# Patient Record
Sex: Female | Born: 1980 | Race: White | Hispanic: No | Marital: Married | Smoking: Former smoker
Health system: Southern US, Community
[De-identification: ages and names within clinical notes are randomized; demographics above are authoritative.]

## PROBLEM LIST (undated history)

## (undated) DIAGNOSIS — S8292XA Unspecified fracture of left lower leg, initial encounter for closed fracture: Secondary | ICD-10-CM

## (undated) DIAGNOSIS — N750 Cyst of Bartholin's gland: Secondary | ICD-10-CM

## (undated) DIAGNOSIS — S82891A Other fracture of right lower leg, initial encounter for closed fracture: Secondary | ICD-10-CM

## (undated) DIAGNOSIS — F419 Anxiety disorder, unspecified: Secondary | ICD-10-CM

## (undated) DIAGNOSIS — E282 Polycystic ovarian syndrome: Secondary | ICD-10-CM

## (undated) DIAGNOSIS — R87629 Unspecified abnormal cytological findings in specimens from vagina: Secondary | ICD-10-CM

## (undated) HISTORY — PX: WISDOM TOOTH EXTRACTION: SHX21

---

## 2011-09-09 ENCOUNTER — Ambulatory Visit: Payer: PRIVATE HEALTH INSURANCE

## 2011-09-09 ENCOUNTER — Ambulatory Visit (INDEPENDENT_AMBULATORY_CARE_PROVIDER_SITE_OTHER): Payer: PRIVATE HEALTH INSURANCE | Admitting: Internal Medicine

## 2011-09-09 DIAGNOSIS — M25571 Pain in right ankle and joints of right foot: Secondary | ICD-10-CM

## 2011-09-09 DIAGNOSIS — S92309A Fracture of unspecified metatarsal bone(s), unspecified foot, initial encounter for closed fracture: Secondary | ICD-10-CM

## 2011-09-09 DIAGNOSIS — M25579 Pain in unspecified ankle and joints of unspecified foot: Secondary | ICD-10-CM

## 2011-09-09 MED ORDER — HYDROCODONE-ACETAMINOPHEN 5-500 MG PO TABS: 1.0000 | ORAL_TABLET | Freq: Three times a day (TID) | ORAL | Status: AC | PRN

## 2011-09-09 NOTE — Patient Instructions (Signed)
Metatarsal Fracture, Undisplaced   A metatarsal fracture is a break in the bone(s) of the foot. These are the bones of the foot that connect your toes to the bones of the ankle.   DIAGNOSIS   The diagnoses of these fractures are usually made with X-rays. If there are problems in the forefoot and x-rays are normal a later bone scan will usually make the diagnosis.   TREATMENT AND HOME CARE INSTRUCTIONS   Treatment may or may not include a cast or walking shoe. When casts are needed the use is usually for short periods of time so as not to slow down healing with muscle wasting (atrophy).   Activities should be stopped until further advised by your caregiver.   Wear shoes with adequate shock absorbing capabilities and stiff soles.   Alternative exercise may be undertaken while waiting for healing. These may include bicycling and swimming, or as your caregiver suggests.   It is important to keep all follow-up visits or specialty referrals. The failure to keep these appointments could result in improper bone healing and chronic pain or disability.   Warning: Do not drive a car or operate a motor vehicle until your caregiver specifically tells you it is safe to do so.   IF YOU DO NOT HAVE A CAST OR SPLINT:   You may walk on your injured foot as tolerated or advised.   Do not put any weight on your injured foot for as long as directed by your caregiver. Slowly increase the amount of time you walk on the foot as the pain allows or as advised.   Use crutches until you can bear weight without pain. A gradual increase in weight bearing may help.   Apply ice to the injury for 15 to 20 minutes each hour while awake for the first 2 days. Put the ice in a plastic bag and place a towel between the bag of ice and your skin.   Only take over-the-counter or prescription medicines for pain, discomfort, or fever as directed by your caregiver.   SEEK IMMEDIATE MEDICAL CARE IF:   Your cast gets damaged or breaks.   You have continued  severe pain or more swelling than you did before the cast was put on, or the pain is not controlled with medications.   Your skin or nails below the injury turn blue or grey, or feel cold or numb.   There is a bad smell, or new stains or pus-like (purulent) drainage coming from the cast.   MAKE SURE YOU:   Understand these instructions.   Will watch your condition.   Will get help right away if you are not doing well or get worse.   Document Released: 03/12/2002 Document Revised: 06/09/2011 Document Reviewed: 02/01/2008   Exit[employer] Patient Information 2012 [employer].

## 2011-09-09 NOTE — Progress Notes (Signed)
  Subjective:    Patient ID: Angela George, female    DOB: Nov 23, 1980, 31 y.o.   MRN: [id number]  HPIFell last night coming out of her house on the porch Today can bear weight on right foot and there is marked swelling along the lateral aspect of the foot     Review of Systems     Objective:   Physical Exam  The right foot has abrasions on the fourth and fifth Toes with marked swelling along the lateral aspect of the foot and great tenderness over the proximal fifth metatarsal  the ankle examination is normal      [facility] reading (PRIMARY) by  Dr.Aqueelah Cotrell=Fracture of fifth metatarsal  Assessment & Plan:  Fracture fifth metatarsal in stable position  Long Cam Walker plus crutches with weightbearing only as tolerated until followup with orthopedics at [facility] next week

## 2013-06-19 ENCOUNTER — Ambulatory Visit (INDEPENDENT_AMBULATORY_CARE_PROVIDER_SITE_OTHER): Payer: BC Managed Care – PPO | Admitting: Family Medicine

## 2013-06-19 VITALS — BP 126/74 | HR 83 | Temp 98.7°F | Resp 17 | Ht 64.5 in | Wt 144.0 lb

## 2013-06-19 DIAGNOSIS — H6983 Other specified disorders of Eustachian tube, bilateral: Secondary | ICD-10-CM

## 2013-06-19 DIAGNOSIS — H6980 Other specified disorders of Eustachian tube, unspecified ear: Secondary | ICD-10-CM

## 2013-06-19 DIAGNOSIS — H6590 Unspecified nonsuppurative otitis media, unspecified ear: Secondary | ICD-10-CM

## 2013-06-19 MED ORDER — AMOXICILLIN-POT CLAVULANATE 875-125 MG PO TABS: 1.0000 | ORAL_TABLET | Freq: Two times a day (BID) | ORAL | Status: DC

## 2013-06-19 MED ORDER — PSEUDOEPHEDRINE HCL ER 120 MG PO TB12: 120.0000 mg | ORAL_TABLET | Freq: Two times a day (BID) | ORAL | Status: DC

## 2013-06-19 MED ORDER — FLUTICASONE PROPIONATE 50 MCG/ACT NA SUSP: 2.0000 | Freq: Every day | NASAL | Status: DC

## 2013-06-19 NOTE — Progress Notes (Signed)
Subjective:    Patient ID: Angela George, female    DOB: 04-20-1981, 32 y.o.   MRN: [id number] This chart was scribed for Sherren Mocha, MD by Valera Castle, ED Scribe. This patient was seen in room 2 and the patient's care was started at 7:50 PM.  Chief Complaint  Patient presents with  . Otalgia    HPI Angela George is a 32 y.o. female with h/o ear infections who presents to the [facility] complaining of right ear otalgia, onset 2 days ago. She reports the pain is deep inside her ear. She reports associated sore throat, swollen glands. She reports having an ear infection 1 month ago. She reports she usually has pain with her ear infections, and states sometimes she has chills, but rarely has fevers. She reports that her ears pop a lot. She reports that using her blow dryer over her ear will relieve the pain temporarily. She reports trying some Advil, with no relief. She denies having tried Sudafed and nasal sprays. She states she uses humidifier in the winter, but has not pulled it out yet. She denies knowing if she has been around anyone who has been sick. She denies fever, chills, congestion, cough, and any other associated symptoms. She denies any known allergies. She denies any other medical history.   PCP - No primary provider on file.  There are no active problems to display for this patient.  History reviewed. No pertinent past medical history. History reviewed. No pertinent past surgical history. No Known Allergies Prior to Admission medications   Not on File   No family history on file. History   Social History  . Marital Status: Married    Spouse Name: N/A    Number of Children: N/A  . Years of Education: N/A   Occupational History  . Not on file.   Social History Main Topics  . Smoking status: Passive Smoke Exposure - Never Smoker    Types: Cigarettes  . Smokeless tobacco: Not on file  . Alcohol Use: Not on file  . Drug Use: Not on file  . Sexual Activity: Not on  file   Other Topics Concern  . Not on file   Social History Narrative  . No narrative on file    Review of Systems  Constitutional: Negative for fever and chills.  HENT: Positive for ear pain (right ear) and sore throat. Negative for congestion, ear discharge, postnasal drip, rhinorrhea, sinus pressure, sneezing, tinnitus and trouble swallowing.   Respiratory: Negative for cough, shortness of breath and wheezing.   Neurological: Negative for headaches.  Hematological: Positive for adenopathy.  Psychiatric/Behavioral: Negative for sleep disturbance.    BP 126/74  Pulse 83  Temp(Src) 98.7 F (37.1 C) (Oral)  Resp 17  Ht 5' 4.5" (1.638 m)  Wt 144 lb (65.318 kg)  BMI 24.34 kg/m2  SpO2 100%  LMP 06/13/2013 Objective:   Physical Exam  Nursing note and vitals reviewed. Constitutional: She is oriented to person, place, and time. She appears well-developed and well-nourished. No distress.  HENT:  Head: Normocephalic and atraumatic.  Right Ear: External ear and ear canal normal. Tympanic membrane is retracted. Tympanic membrane is not erythematous. A middle ear effusion is present.  Left Ear: External ear and ear canal normal. Tympanic membrane is erythematous.  Nose: Nose normal. No mucosal edema.  Mouth/Throat: Posterior oropharyngeal erythema present. No oropharyngeal exudate or posterior oropharyngeal edema.  Eyes: EOM are normal.  Neck: Neck supple. No tracheal deviation present.  No thyromegaly present.  Bilateral tonsillar adenopathy.   Cardiovascular: Normal rate, regular rhythm and normal heart sounds.  Exam reveals no gallop and no friction rub.   No murmur heard. Pulmonary/Chest: Effort normal and breath sounds normal. No respiratory distress. She has no wheezes. She has no rales.  Musculoskeletal: Normal range of motion.  Lymphadenopathy:    She has cervical adenopathy (Superficial cervical adenopathy bilaterally. ).  Neurological: She is alert and oriented to person,  place, and time.  Skin: Skin is warm and dry.  Psychiatric: She has a normal mood and affect. Her behavior is normal.      Assessment & Plan:   Eustachian tube dysfunction, bilateral  Otitis, serous, right SNAP RX given for Augmentin but pt recommended to try conservative treatment first for relief than can fill antibiotic if sxs progress - see pt instructions. Meds ordered this encounter  Medications  . amoxicillin-clavulanate (AUGMENTIN) 875-125 MG per tablet    Sig: Take 1 tablet by mouth 2 (two) times daily.    Dispense:  20 tablet    Refill:  0  . fluticasone (FLONASE) 50 MCG/ACT nasal spray    Sig: Place 2 sprays into both nostrils at bedtime.    Dispense:  16 g    Refill:  3  . pseudoephedrine (SUDAFED 12 HOUR) 120 MG 12 hr tablet    Sig: Take 1 tablet (120 mg total) by mouth 2 (two) times daily.    Dispense:  30 tablet    Refill:  1    I personally performed the services described in this documentation, which was scribed in my presence. The recorded information has been reviewed and considered, and addended by me as needed.  Norberto Sorenson, MD MPH

## 2013-06-19 NOTE — Patient Instructions (Signed)
Hot showers or breathing in steam may help open your eustachian tube.  Using a netti pot or sinus rinse is also likely to help you feel better and keep this from progressing.  Use the fluticasone nasal spray every night before bed for at least 2 weeks.  If this is helping, it is completely safe to stay on long term - at least during allergy season or the season that you tend to get your ear infections.  I recommend augmenting with 12 hr sudafed (behind the counter) to help you move out the congestion and open up your nasopharyngeal area.  If no improvement or you are getting worse, come back as you might need a course of steroids but hopefully with all of the above, you can avoid it.  Serous Otitis Media  Serous otitis media is fluid in the middle ear space. This space contains the bones for hearing and air. Air in the middle ear space helps to transmit sound.  The air gets there through the eustachian tube. This tube goes from the back of the nose (nasopharynx) to the middle ear space. It keeps the pressure in the middle ear the same as the outside world. It also helps to drain fluid from the middle ear space. CAUSES  Serous otitis media occurs when the eustachian tube gets blocked. Blockage can come from:  Ear infections.  Colds and other upper respiratory infections.  Allergies.  Irritants such as cigarette smoke.  Sudden changes in air pressure (such as descending in an airplane).  Enlarged adenoids.  A mass in the nasopharynx. During colds and upper respiratory infections, the middle ear space can become temporarily filled with fluid. This can happen after an ear infection also. Once the infection clears, the fluid will generally drain out of the ear through the eustachian tube. If it does not, then serous otitis media occurs. SIGNS AND SYMPTOMS   Hearing loss.  A feeling of fullness in the ear, without pain.  Young children may not show any symptoms but may show slight behavioral  changes, such as agitation, ear pulling, or crying. DIAGNOSIS  Serous otitis media is diagnosed by an ear exam. Tests may be done to check on the movement of the eardrum. Hearing exams may also be done. TREATMENT  The fluid most often goes away without treatment. If allergy is the cause, allergy treatment may be helpful. Fluid that persists for several months may require minor surgery. A small tube is placed in the eardrum to:  Drain the fluid.  Restore the air in the middle ear space. In certain situations, antibiotics are used to avoid surgery. Surgery may be done to remove enlarged adenoids (if this is the cause). HOME CARE INSTRUCTIONS   Keep children away from tobacco smoke.  Be sure to keep any follow-up appointments. SEEK MEDICAL CARE IF:   Your hearing is not better in 3 months.  Your hearing is worse.  You have ear pain.  You have drainage from the ear.  You have dizziness.  You have serous otitis media only in one ear or have any bleeding from your nose (epistaxis).  You notice a lump on your neck. MAKE SURE YOU:  Understand these instructions.   Will watch your condition.   Will get help right away if you are not doing well or get worse.  Document Released: 09/10/2003 Document Revised: 02/20/2013 Document Reviewed: 01/15/2013 [facility] Patient Information 2014 [place], [place]. Barotitis Media Barotitis media is soreness (inflammation) of the area behind the  eardrum (middle ear). This occurs when the auditory tube (Eustachian tube) leading from the back of the throat to the eardrum is blocked. When it is blocked air cannot move in and out of the middle ear to equalize pressure changes. These pressure changes come from changes in altitude when:  Flying.  Driving in the mountains.  Diving. Problems are more likely to occur with pressure changes during times when you are congested as from:  Hay fever.  Upper respiratory infection.  A cold. Damage or  hearing loss (barotrauma) caused by this may be permanent. HOME CARE INSTRUCTIONS   Use medicines as recommended by your caregiver. Over the counter medicines will help unblock the canal and can help during times of air travel.  Do not put anything into your ears to clean or unplug them. Eardrops will not be helpful.  Do not swim, dive, or fly until your caregiver says it is all right to do so. If these activities are necessary, chewing gum with frequent swallowing may help. It is also helpful to hold your nose and gently blow to pop your ears for equalizing pressure changes. This forces air into the Eustachian tube.  For little ones with problems, give your baby a bottle of water or juice during periods when pressure changes would be anticipated such as during take offs and landings associated with air travel.  Only take over-the-counter or prescription medicines for pain, discomfort, or fever as directed by your caregiver.  A decongestant may be helpful in de-congesting the middle ear and make pressure equalization easier. This can be even more effective if the drops (spray) are delivered with the head lying over the edge of a bed with the head tilted toward the ear on the affected side.  If your caregiver has given you a follow-up appointment, it is very important to keep that appointment. Not keeping the appointment could result in a chronic or permanent injury, pain, hearing loss and disability. If there is any problem keeping the appointment, you must call back to this facility for assistance. SEEK IMMEDIATE MEDICAL CARE IF:   You develop a severe headache, dizziness, severe ear pain, or bloody or pus-like drainage from your ears.  An oral temperature above 102 F (38.9 C) develops.  Your problems do not improve or become worse. MAKE SURE YOU:   Understand these instructions.  Will watch your condition.  Will get help right away if you are not doing well or get worse. Document  Released: 06/17/2000 Document Revised: 09/12/2011 Document Reviewed: 01/15/2013 [facility] Patient Information 2014 [place], [place].

## 2013-07-04 HISTORY — PX: BARTHOLIN CYST MARSUPIALIZATION: SHX5383

## 2013-12-31 ENCOUNTER — Emergency Department (HOSPITAL_COMMUNITY)
Admission: EM | Admit: 2013-12-31 | Discharge: 2013-12-31 | Disposition: A | Discharge status: Home / Self Care | Payer: BC Managed Care – PPO | Attending: Emergency Medicine | Admitting: Emergency Medicine

## 2013-12-31 ENCOUNTER — Encounter (HOSPITAL_COMMUNITY): Payer: Self-pay | Admitting: Emergency Medicine

## 2013-12-31 DIAGNOSIS — Z79899 Other long term (current) drug therapy: Secondary | ICD-10-CM | POA: Insufficient documentation

## 2013-12-31 DIAGNOSIS — N751 Abscess of Bartholin's gland: Secondary | ICD-10-CM | POA: Insufficient documentation

## 2013-12-31 DIAGNOSIS — F17200 Nicotine dependence, unspecified, uncomplicated: Secondary | ICD-10-CM | POA: Insufficient documentation

## 2013-12-31 MED ORDER — HYDROCODONE-ACETAMINOPHEN 5-325 MG PO TABS: 1.0000 | ORAL_TABLET | ORAL | Status: DC | PRN

## 2013-12-31 MED ORDER — LIDOCAINE HCL 1 % IJ SOLN
30.0000 mL | Freq: Once | INTRAMUSCULAR | Status: AC
  Administered 2013-12-31: 30 mL
  Filled 2013-12-31: qty 40

## 2013-12-31 MED ORDER — MORPHINE SULFATE 4 MG/ML IJ SOLN
4.0000 mg | Freq: Once | INTRAMUSCULAR | Status: AC
  Administered 2013-12-31: 4 mg via INTRAVENOUS
  Filled 2013-12-31: qty 1

## 2013-12-31 MED ORDER — ONDANSETRON HCL 4 MG/2ML IJ SOLN
4.0000 mg | Freq: Once | INTRAMUSCULAR | Status: AC | PRN
  Administered 2013-12-31: 4 mg via INTRAVENOUS
  Filled 2013-12-31: qty 2

## 2013-12-31 NOTE — ED Provider Notes (Signed)
CSN: [id number]     Arrival date & time 12/31/13  2152 History   First MD Initiated Contact with Patient 12/31/13 2215     Chief Complaint  Patient presents with  . Bartholin's Cyst   Patient is a 33 y.o. female presenting with abscess. The history is provided by the patient.  Abscess Location:  Ano-genital Ano-genital abscess location:  Vagina Size:  The size of an egg Abscess quality: painful   Red streaking: no   Duration:  3 days Progression:  Worsening Pain details:    Severity:  Severe Chronicity:  New Relieved by:  Nothing Exacerbated by: palpation and movement. Ineffective treatments:  Warm water soaks Associated symptoms: no anorexia and no fever     History reviewed. No pertinent past medical history. Past Surgical History  Procedure Laterality Date  . Wisdom tooth extraction     No family history on file. History  Substance Use Topics  . Smoking status: Current Some Day Smoker -- 0.10 packs/day for 10 years    Types: Cigarettes  . Smokeless tobacco: Never Used  . Alcohol Use: Yes     Comment: Socially    OB History   Grav Para Term Preterm Abortions TAB SAB Ect Mult Living                 Review of Systems  Constitutional: Negative for fever.  Gastrointestinal: Negative for anorexia.  All other systems reviewed and are negative.     Allergies  Review of patient's allergies indicates no known allergies.  Home Medications   Prior to Admission medications   Medication Sig Start Date End Date Taking? Authorizing Provider  amoxicillin-clavulanate (AUGMENTIN) 875-125 MG per tablet Take 1 tablet by mouth once.   Yes Historical Provider, MD  Diethylpropion HCl (TENUATE DOSPAN PO) Take 75 mg by mouth daily.   Yes Historical Provider, MD  ibuprofen (ADVIL,MOTRIN) 200 MG tablet Take 600 mg by mouth every 6 (six) hours as needed for moderate pain.   Yes Historical Provider, MD  HYDROcodone-acetaminophen (NORCO/VICODIN) 5-325 MG per tablet Take 1-2 tablets  by mouth every 4 (four) hours as needed. 12/31/13   Linwood DibblesJon Knapp, MD   BP 119/81  Pulse 73  Temp(Src) 98.1 F (36.7 C) (Oral)  Resp 18  SpO2 99%  LMP 11/03/2013 Physical Exam  Nursing note and vitals reviewed. Constitutional: She appears well-developed and well-nourished. No distress.  HENT:  Head: Normocephalic and atraumatic.  Right Ear: External ear normal.  Left Ear: External ear normal.  Eyes: Conjunctivae are normal. Right eye exhibits no discharge. Left eye exhibits no discharge. No scleral icterus.  Neck: Neck supple. No tracheal deviation present.  Cardiovascular: Normal rate.   Pulmonary/Chest: Effort normal. No stridor. No respiratory distress.  Genitourinary:    There is tenderness on the left labia. There is erythema and tenderness around the vagina. No vaginal discharge found.  Bartholin abscess left labia  Musculoskeletal: She exhibits no edema.  Neurological: She is alert. Cranial nerve deficit: no gross deficits.  Skin: Skin is warm and dry. No rash noted.  Psychiatric: She has a normal mood and affect.    ED Course  INCISION AND DRAINAGE Date/Time: 12/31/2013 11:35 PM Performed by: Linwood DibblesKNAPP, JON Authorized by: Linwood DibblesKNAPP, JON Consent: Verbal consent obtained. Risks and benefits: risks, benefits and alternatives were discussed Consent given by: patient Time out: Immediately prior to procedure a "time out" was called to verify the correct patient, procedure, equipment, support staff and site/side marked as required. Type: abscess  Body area: anogenital Location details: Bartholin's gland Anesthesia: local infiltration Local anesthetic: lidocaine 1% without epinephrine Anesthetic total: 6 ml Patient sedated: no Scalpel size: 11 Incision type: single straight Complexity: complex Drainage: purulent Drainage amount: copious Wound treatment: drain placed (word catheter) Patient tolerance: Patient tolerated the procedure well with no immediate  complications.     MDM   Final diagnoses:  Bartholin's gland abscess    Pt improved after I&D.  Word catheter placed.  Follow up with GYN.  Rx for norco.  Continue warm soaks    Linwood DibblesJon Knapp, MD 12/31/13 2337

## 2013-12-31 NOTE — Discharge Instructions (Signed)
Bartholin's Cyst or Abscess °Bartholin's glands are small glands located within the folds of skin (labia) along the sides of the lower opening of the vagina (birth canal). A cyst may develop when the duct of the gland becomes blocked. When this happens, fluid that accumulates within the cyst can become infected. This is known as an abscess. The Bartholin gland produces a mucous fluid to lubricate the outside of the vagina during sexual intercourse. °SYMPTOMS  °· Patients with a small cyst may not have any symptoms. °· Mild discomfort to severe pain depending on the size of the cyst and if it is infected (abscess). °· Pain, redness, and swelling around the lower opening of the vagina. °· Painful intercourse. °· Pressure in the perineal area. °· Swelling of the lips of the vagina (labia). °· The cyst or abscess can be on one side or both sides of the vagina. °DIAGNOSIS  °· A large swelling is seen in the lower vagina area by your caregiver. °· Painful to touch. °· Redness and pain, if it is an abscess. °TREATMENT  °· Sometimes the cyst will go away on its own. °· Apply warm wet compresses to the area or take hot sitz baths several times a day. °· An incision to drain the cyst or abscess with local anesthesia. °· Culture the pus, if it is an abscess. °· Antibiotic treatment, if it is an abscess. °· Cut open the gland and suture the edges to make the opening of the gland bigger (marsupialization). °· Remove the whole gland if the cyst or abscess returns. °PREVENTION  °· Practice good hygiene. °· Clean the vaginal area with a mild soap and soft cloth when bathing. °· Do not rub hard in the vaginal area when bathing. °· Protect the crotch area with a padded cushion if you take long bike rides or ride horses. °· Be sure you are well lubricated when you have sexual intercourse. °HOME CARE INSTRUCTIONS  °· If your cyst or abscess was opened, a small piece of gauze, or a drain, may have been placed in the wound to allow  drainage. Do not remove this gauze or drain unless directed by your caregiver. °· Wear feminine pads, not tampons, as needed for any drainage or bleeding. °· If antibiotics were prescribed, take them exactly as directed. Finish the entire course. °· Only take over-the-counter or prescription medicines for pain, discomfort, or fever as directed by your caregiver. °SEEK IMMEDIATE MEDICAL CARE IF:  °· You have an increase in pain, redness, swelling, or drainage. °· You have bleeding from the wound which results in the use of more than the number of pads suggested by your caregiver in 24 hours. °· You have chills. °· You have a fever. °· You develop any new problems (symptoms) or aggravation of your existing condition. °MAKE SURE YOU:  °· Understand these instructions. °· Will watch your condition. °· Will get help right away if you are not doing well or get worse. °Document Released: 06/20/2005 Document Revised: 09/12/2011 Document Reviewed: 02/06/2008 °[employer]® Patient Information ©2015 [employer]. This information is not intended to replace advice given to you by your health care provider. Make sure you discuss any questions you have with your health care provider. ° °

## 2013-12-31 NOTE — ED Notes (Addendum)
Pt reports that she believes she has a bartholin cyst that is the size of an egg, which began three days ago and is painful. Pt reports using sitz baths, which she states has not improved the pain or swelling. Pt reports seeing a small amount of vaginal bleeding two days ago after intercourse, however denies vaginal discharge. Pt is A/O x4, in NAD, and vitals are WDL.

## 2014-04-22 ENCOUNTER — Ambulatory Visit: Payer: Self-pay | Admitting: Obstetrics & Gynecology

## 2014-04-22 LAB — HEMOGLOBIN: HGB: 13.3 g/dL (ref 12.0–16.0)

## 2014-04-22 LAB — WBC: WBC: 9.7 10*3/uL (ref 3.6–11.0)

## 2014-04-25 ENCOUNTER — Ambulatory Visit: Payer: Self-pay | Admitting: Obstetrics & Gynecology

## 2014-10-25 NOTE — Op Note (Signed)
PATIENT NAME:  Angela George, Gift E MR#:  [id number] DATE OF BIRTH:  June 14, 1981  DATE OF PROCEDURE:  04/25/2014  PREOPERATIVE DIAGNOSIS:  Bilateral Bartholin gland cyst.    POSTOPERATIVE DIAGNOSIS:  Bilateral Bartholin gland cyst.    PROCEDURE:  Marsupialization of bilateral Bartholin gland cyst.    ANESTHESIA:  LMA.    SURGEON:  Chelsea C. Ward, MD    ASSISTANT:  Conard NovakStephen D. Jackson, MD     BLOOD LOSS:  Minimal.    IV FLUIDS:  Crystalloid.     SPECIMENS:  None.    FINDINGS:  A 1 cm Bartholin cyst on the right and oblong 2.5 cm x 1 cm Bartholin cyst on the left.    COMPLICATIONS:  None apparent.    DISPOSITION:  Stable to PACU.    OPERATIVE NOTE:  The patient presented to the preop area with signed informed consent and had no changes to her H and P.  She was brought to the operating room and given general anesthesia through an LMA.  She was placed in the high lithotomy position in candy cane stirrups and prepped with Betadine solution on the vulva and the vagina.  A surgical timeout was called and all parties agreed.    Inspection of the bilateral vulva and vagina were as above.  A 15 blade was used to incise the skin on the right..  A hemostat was used to grasp the cyst wall.  This was entered also with a 15 blade.  The contents of the cyst were clear and yellow.  The cyst wall was then everted and 4 stitches of 4-0 Vicryl were placed in a cruciate configuration.  Bleeding was minimal.    The attention was turned to the left.  The elongated cyst wall was palpated, and as was on the right the 15 blade was used to incise the skin and the cyst wall was grasped with the hemostat and entered with the 15 blade.  The walls were then opened.  Expression of the cystic contents was performed.  The cyst walls were then everted and 4-0 Vicryl in 4 stitches were placed to maintain opening of the cyst wall.  Again bleeding was minimal.    The areas of incision were then injected with 1% lidocaine, 10  mL total, 5 mL on each side.  The patient tolerated the procedure.  The counts were correct x 2 and the patient was brought to PACU at the end of the procedure in a stable condition for recovery.     ____________________________ Elenora Fenderhelsea C. Ward, MD ccw:AT D: 04/25/2014 17:46:24 ET T: 04/26/2014 03:46:03 ET JOB#: [id number]  cc: Chelsea C. Ward, MD, <Dictator> Leola BrazilHELSEA C WARD MD ELECTRONICALLY SIGNED 05/03/2014 9:32

## 2015-03-31 LAB — OB RESULTS CONSOLE ABO/RH: RH TYPE: NEGATIVE

## 2015-03-31 LAB — OB RESULTS CONSOLE RUBELLA ANTIBODY, IGM: RUBELLA: IMMUNE

## 2015-03-31 LAB — OB RESULTS CONSOLE HIV ANTIBODY (ROUTINE TESTING): HIV: NONREACTIVE

## 2015-03-31 LAB — OB RESULTS CONSOLE RPR: RPR: NONREACTIVE

## 2015-03-31 LAB — OB RESULTS CONSOLE ANTIBODY SCREEN: ANTIBODY SCREEN: NEGATIVE

## 2015-03-31 LAB — OB RESULTS CONSOLE GC/CHLAMYDIA
Chlamydia: NEGATIVE
Gonorrhea: NEGATIVE

## 2015-03-31 LAB — OB RESULTS CONSOLE HEPATITIS B SURFACE ANTIGEN: HEP B S AG: NEGATIVE

## 2015-04-03 ENCOUNTER — Other Ambulatory Visit (HOSPITAL_COMMUNITY): Payer: Self-pay | Admitting: Obstetrics and Gynecology

## 2015-04-03 DIAGNOSIS — Z3A13 13 weeks gestation of pregnancy: Secondary | ICD-10-CM

## 2015-04-03 DIAGNOSIS — Z3682 Encounter for antenatal screening for nuchal translucency: Secondary | ICD-10-CM

## 2015-04-03 DIAGNOSIS — IMO0001 Reserved for inherently not codable concepts without codable children: Secondary | ICD-10-CM

## 2015-04-10 ENCOUNTER — Ambulatory Visit (HOSPITAL_COMMUNITY)
Admission: RE | Admit: 2015-04-10 | Discharge: 2015-04-10 | Disposition: A | Discharge status: Home / Self Care | Payer: BLUE CROSS/BLUE SHIELD | Source: Ambulatory Visit | Attending: Obstetrics and Gynecology | Admitting: Obstetrics and Gynecology

## 2015-04-10 ENCOUNTER — Other Ambulatory Visit (HOSPITAL_COMMUNITY): Payer: Self-pay | Admitting: Obstetrics and Gynecology

## 2015-04-10 ENCOUNTER — Encounter (HOSPITAL_COMMUNITY): Payer: Self-pay

## 2015-04-10 DIAGNOSIS — Z3A13 13 weeks gestation of pregnancy: Secondary | ICD-10-CM | POA: Insufficient documentation

## 2015-04-10 DIAGNOSIS — O09811 Supervision of pregnancy resulting from assisted reproductive technology, first trimester: Secondary | ICD-10-CM

## 2015-04-10 DIAGNOSIS — O30001 Twin pregnancy, unspecified number of placenta and unspecified number of amniotic sacs, first trimester: Secondary | ICD-10-CM

## 2015-04-10 DIAGNOSIS — Z3682 Encounter for antenatal screening for nuchal translucency: Secondary | ICD-10-CM

## 2015-04-10 DIAGNOSIS — O09511 Supervision of elderly primigravida, first trimester: Secondary | ICD-10-CM

## 2015-04-10 DIAGNOSIS — Z8742 Personal history of other diseases of the female genital tract: Secondary | ICD-10-CM

## 2015-04-10 DIAGNOSIS — Z315 Encounter for genetic counseling: Secondary | ICD-10-CM | POA: Insufficient documentation

## 2015-04-10 DIAGNOSIS — O09521 Supervision of elderly multigravida, first trimester: Secondary | ICD-10-CM

## 2015-04-10 DIAGNOSIS — O30041 Twin pregnancy, dichorionic/diamniotic, first trimester: Secondary | ICD-10-CM | POA: Insufficient documentation

## 2015-04-10 DIAGNOSIS — O09529 Supervision of elderly multigravida, unspecified trimester: Secondary | ICD-10-CM

## 2015-04-10 DIAGNOSIS — IMO0001 Reserved for inherently not codable concepts without codable children: Secondary | ICD-10-CM

## 2015-04-13 ENCOUNTER — Encounter (HOSPITAL_COMMUNITY): Payer: Self-pay

## 2015-04-13 DIAGNOSIS — O09529 Supervision of elderly multigravida, unspecified trimester: Secondary | ICD-10-CM | POA: Insufficient documentation

## 2015-04-13 NOTE — Progress Notes (Addendum)
Genetic Counseling  High-Risk Gestation Note  Appointment Date:  04/10/2015 Referred By: Carrington Clamp, MD Date of Birth:  1980-08-21 Partner:  Reed Breech   Pregnancy History: [id number]  Estimated Date of Delivery: 10/13/15 Estimated Gestational Age: [redacted]w[redacted]d Attending: Ledon Snare, MD   Ms. Fayrene Angela George and her partner, Mr. Reed Breech, were seen for genetic counseling because of a maternal age of 34 y.o. in a dichorionic/diamniotic twin gestation.   In Summary:   Discussed maternal age related 1 in 65 risk for fetal aneuploidy in twin gestation  Patient elected for NT ultrasound and NIPS ([facility]) today; Declined CVS and amniocentesis  Family history significant for previous child with gastroschisis for Mr. Kristen Cardinal and cystic fibrosis for Mr. Summerlin's cousin; See detailed discussion below  Patient declined cystic fibrosis carrier screening today    They were counseled regarding maternal age and the association with risk for chromosome conditions due to nondisjunction with aging of the ova.   We reviewed chromosomes, nondisjunction, and the associated 1 in 84 risk for fetal aneuploidy for either twin related to a maternal age of 34 y.o. at [redacted]w[redacted]d gestation for twin gestation.  They were counseled that the risk for aneuploidy decreases as gestational age increases, accounting for those pregnancies which spontaneously abort.  We specifically discussed Down syndrome (trisomy 44), trisomies 71 and 26, and sex chromosome aneuploidies (47,XXX and 47,XXY) including the common features and prognoses of each.   We reviewed available screening options including First Screen, Quad screen, noninvasive prenatal screening (NIPS)/ prenatal cell free DNA (cfDNA) testing, and detailed ultrasound.  They were counseled that screening tests are used to modify a patient's a priori risk for aneuploidy, typically based on age. This estimate provides a pregnancy specific risk assessment. We reviewed  the benefits and limitations of each option. Specifically, we discussed the conditions for which each test screens, the detection rates, and false positive rates of each. We specifically discussed that sensitivity and specificity for maternal serum screening and NIPS are reduced in a twin gestation and that sex chromosome aneuploidy assessment is typically not provided on NIPS for a twin gestation. They were also counseled regarding diagnostic testing via CVS and amniocentesis. We reviewed the approximate 1 in 100 risk for complications for CVS and the approximate 1 in 300-500 risk for complications for amniocentesis, including spontaneous pregnancy loss. After consideration of all the options, they elected to proceed with NIPS ([facility]).  Those results will be available in 8-10 days.  Diagnostic testing was declined today.   They also expressed interest in pursuing a nuchal translucency ultrasound, which was performed today.  The report will be documented separately.  Detailed ultrasound is available to the patient in the second trimester. No follow-up appointments were scheduled with our office at this time. They understand that screening tests cannot rule out all birth defects or genetic syndromes. The patient was advised of this limitation and states she still does not want additional testing at this time.   Both family histories were reviewed and found to be contributory for gastroschisis for Mr. Summerlin's daughter with a previous partner. 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 Dell Ponto at [facility]. His daughter is currently 18 years old and reportedly healthy. She reportedly has no additional healthy concerns. Gastroschisis occurs in approximately 1 in 5,000-10,000 live births, more often in babies born to younger mothers. Smoking during pregnancy has also been associated  with increased risk for gastroschisis.  We discussed that gastroschisis is typically not associated with chromosomal anomalies or other structural malformations with the exception of intestinal atresia.  The majority of cases of gastroschisis are thought to be sporadic and multifactorial in etiology, with a low risk of recurrence.  However, there have been reports of both autosomal recessive and dominant inheritance. Given the reported family history, recurrence risk for the current pregnancy is likely low. Detailed ultrasound is available to assess for abdominal wall defects. The couple understands that ultrasound cannot diagnose or rule out all birth defects prenatally.   Mr. Kristen Cardinal also reported a maternal first cousin once removed with cystic fibrosis. This relative is currently 36 years old, and he has limited information regarding the relative. No additional relatives were reported with cystic fibrosis. Ms. Galentine reported no known individuals with cystic fibrosis in her family history, and consanguinity was denied by the couple. Cystic fibrosis (CF) is a common autosomal recessive genetic condition in the Caucasian population occurring in approximately 1 in 46,300 Caucasian births.  This means approximately 1 in 29 Caucasians is a CF carrier.  We spent time reviewing the autosomal recessive inheritance of cystic fibrosis and the 25% chance, when both parents are carriers, to have a child with CF.  We discussed that there are thought to be thousands of mutations which can cause the CF gene to not function properly. CF results in thickened secretions in the lungs, digestive and reproductive systems.  This life-limiting condition is characterized by chronic respiratory infections necessitating daily chest therapies, pancreatic dysfunction disrupting the body's ability to break down food and extract nutrients as it should, and possible infertility in males.  With the advent of new therapies, the average  lifespan for people with CF is in the 30's.  Symptoms can be considerably variable, and in some cases symptoms may be quite mild.  Sometimes, the specific mutations a person carries can indicate whether their symptoms may be associated with milder or more severe symptoms; this is not always the case. Prior to carrier screening for Mr. Kristen Cardinal, his chance to be a CF carrier given the reported family history is approximately 1 in 8. Ms. Satterfield would have the general population risk of approximately 1 in 29, given her reported family history and given that reportedly has not had CF carrier screening performed. Thus, prior to carrier screening for either individual, the chance for CF in the current pregnancy is approximately 1 in 928 (0.1%).   We discussed the option of CF carrier screening with the couple. Carrier screening assess for the most common disease causing CFTR mutations. Thus, a negative carrier screen would reduce but not eliminate the chance to be a carrier. In the case that both partners are identified to be carriers, prenatal diagnosis via CVS or amniocentesis would be available in pregnancy. We also discussed that in [place], the newborn screening test will detect CF, but that carriers may come back as false positives.  After careful consideration, Ms. Canion declined CF carrier screening at this time. Without further information regarding the provided family history, an accurate genetic risk cannot be calculated. Further genetic counseling is warranted if more information is obtained.  Ms. Melucci denied exposure to environmental toxins or chemical agents. She reported approximately 2-4 alcoholic drinks total during the pregnancy.  Prenatal alcohol exposure can increase the risk for growth delays, small head size, heart defects, eye and facial differences, as well as behavior problems and learning disabilities. The risk of  these to occur tends to increase with the amount of alcohol  consumed. However, because there is no identified safe amount of alcohol in pregnancy, it is recommended to completely avoid alcohol in pregnancy. Given the reported amount of exposure, risk for associated effects are likely low in the current pregnancy. She denied significant viral illnesses during the course of her pregnancy. Her medical and surgical histories were contributory for history of anxiety for which she currently takes ativan. Available animal study data have not suggested an increased risk for adverse pregnancy outcomes with prenatal use of lorazepam (Ativan). However, there are very limited human data regarding prenatal use of this medication. The patient understands that she should not alter her medication use without consultation with her physician.    I counseled this couple regarding the above risks and available options.  The approximate face-to-face time with the genetic counselor was 45 minutes.  Quinn Plowman, MS,  Certified Genetic Counselor 04/13/2015

## 2015-04-14 ENCOUNTER — Encounter (HOSPITAL_COMMUNITY): Payer: Self-pay | Admitting: Obstetrics and Gynecology

## 2015-04-14 ENCOUNTER — Other Ambulatory Visit (HOSPITAL_COMMUNITY): Payer: Self-pay | Admitting: Obstetrics and Gynecology

## 2015-04-16 ENCOUNTER — Encounter (HOSPITAL_COMMUNITY): Payer: Self-pay | Admitting: Obstetrics & Gynecology

## 2015-04-20 ENCOUNTER — Telehealth (HOSPITAL_COMMUNITY): Payer: Self-pay | Admitting: MS"

## 2015-04-20 NOTE — Telephone Encounter (Signed)
Left message for patient to return call.   Angela George 04/20/2015 8:55 AM

## 2015-04-21 ENCOUNTER — Other Ambulatory Visit (HOSPITAL_COMMUNITY): Payer: Self-pay | Admitting: Obstetrics and Gynecology

## 2015-07-05 NOTE — L&D Delivery Note (Addendum)
Patient was C/C/+3 and pushed for <10 minutes with epidural.  NSVD of cephalic female infant, Apgars 8/8, weight pending.  Baby B was located and vertex, mom pushed for approx 45 min and deliveryed NSVD female infant with APGARs 7/8 weight pending The patient had vaginal wall abrasions and one small Left sided vaginal wall laceration repaired with 3-0 vicryl. Fundus was firm, 0.2mg  methergine given prophylactically. EBL was expected amount. Placenta was delivered intact. Vagina was clear.  Baby was vigorous and doing skin to skin with father.  Philip AspenALLAHAN, Angela George

## 2015-09-10 ENCOUNTER — Encounter (HOSPITAL_COMMUNITY): Payer: Self-pay | Admitting: *Deleted

## 2015-09-10 ENCOUNTER — Inpatient Hospital Stay (EMERGENCY_DEPARTMENT_HOSPITAL)
Admission: AD | Admit: 2015-09-10 | Discharge: 2015-09-10 | Disposition: A | Discharge status: Home / Self Care | Payer: BLUE CROSS/BLUE SHIELD | Source: Ambulatory Visit | Attending: Obstetrics | Admitting: Obstetrics

## 2015-09-10 DIAGNOSIS — O133 Gestational [pregnancy-induced] hypertension without significant proteinuria, third trimester: Secondary | ICD-10-CM

## 2015-09-10 DIAGNOSIS — O30043 Twin pregnancy, dichorionic/diamniotic, third trimester: Secondary | ICD-10-CM | POA: Diagnosis not present

## 2015-09-10 DIAGNOSIS — O30049 Twin pregnancy, dichorionic/diamniotic, unspecified trimester: Secondary | ICD-10-CM

## 2015-09-10 DIAGNOSIS — O163 Unspecified maternal hypertension, third trimester: Secondary | ICD-10-CM

## 2015-09-10 DIAGNOSIS — O09529 Supervision of elderly multigravida, unspecified trimester: Secondary | ICD-10-CM

## 2015-09-10 HISTORY — DX: Other fracture of right lower leg, initial encounter for closed fracture: S82.891A

## 2015-09-10 HISTORY — DX: Anxiety disorder, unspecified: F41.9

## 2015-09-10 HISTORY — DX: Unspecified fracture of left lower leg, initial encounter for closed fracture: S82.92XA

## 2015-09-10 HISTORY — DX: Unspecified abnormal cytological findings in specimens from vagina: R87.629

## 2015-09-10 HISTORY — DX: Polycystic ovarian syndrome: E28.2

## 2015-09-10 LAB — URINE MICROSCOPIC-ADD ON

## 2015-09-10 LAB — URINALYSIS, ROUTINE W REFLEX MICROSCOPIC
BILIRUBIN URINE: NEGATIVE
GLUCOSE, UA: NEGATIVE mg/dL
KETONES UR: NEGATIVE mg/dL
Leukocytes, UA: NEGATIVE
NITRITE: NEGATIVE
PH: 6 (ref 5.0–8.0)
PROTEIN: NEGATIVE mg/dL
Specific Gravity, Urine: 1.02 (ref 1.005–1.030)

## 2015-09-10 LAB — COMPREHENSIVE METABOLIC PANEL
ALBUMIN: 2.4 g/dL — AB (ref 3.5–5.0)
ALT: 29 U/L (ref 14–54)
ANION GAP: 6 (ref 5–15)
AST: 38 U/L (ref 15–41)
Alkaline Phosphatase: 158 U/L — ABNORMAL HIGH (ref 38–126)
BUN: 8 mg/dL (ref 6–20)
CHLORIDE: 109 mmol/L (ref 101–111)
CO2: 20 mmol/L — AB (ref 22–32)
Calcium: 8.3 mg/dL — ABNORMAL LOW (ref 8.9–10.3)
Creatinine, Ser: 0.71 mg/dL (ref 0.44–1.00)
GFR calc Af Amer: >60 mL/min (ref >60)
GFR calc non Af Amer: >60 mL/min (ref >60)
GLUCOSE: 65 mg/dL (ref 65–99)
POTASSIUM: 4.1 mmol/L (ref 3.5–5.1)
SODIUM: 135 mmol/L (ref 135–145)
Total Bilirubin: 0.6 mg/dL (ref 0.3–1.2)
Total Protein: 6 g/dL — ABNORMAL LOW (ref 6.5–8.1)

## 2015-09-10 LAB — CBC
HCT: 30.9 % — ABNORMAL LOW (ref 36.0–46.0)
Hemoglobin: 11 g/dL — ABNORMAL LOW (ref 12.0–15.0)
MCH: 32.7 pg (ref 26.0–34.0)
MCHC: 35.6 g/dL (ref 30.0–36.0)
MCV: 92 fL (ref 78.0–100.0)
Platelets: 156 10*3/uL (ref 150–400)
RBC: 3.36 MIL/uL — ABNORMAL LOW (ref 3.87–5.11)
RDW: 13.5 % (ref 11.5–15.5)
WBC: 8 10*3/uL (ref 4.0–10.5)

## 2015-09-10 LAB — PROTEIN / CREATININE RATIO, URINE
Creatinine, Urine: 130 mg/dL
PROTEIN CREATININE RATIO: 0.12 mg/mg{creat} (ref 0.00–0.15)
Total Protein, Urine: 16 mg/dL

## 2015-09-10 NOTE — MAU Provider Note (Signed)
History     CSN: [id number]  Arrival date and time: 09/10/15 1719   First Provider Initiated Contact with Patient 09/10/15 1850      Chief Complaint  Patient presents with  . Hypertension   HPI Pt is 4827w2d G2P0010 pregnant with twins sent from Dr. Henderson CloudHorvath for [facility] evaluation. Pt has noticed increase in ankle swelling, but denies headaches, nausea, vomiting. Babies are active.  Pt denies spotting, bleeding or LOF or abdominal pain.  Past Medical History  Diagnosis Date  . Leg fracture, left   . Ankle fracture, right   . Anxiety   . PCOS (polycystic ovarian syndrome)   . Vaginal Pap smear, abnormal     Past Surgical History  Procedure Laterality Date  . Wisdom tooth extraction    . Bartholin cyst marsupialization Bilateral 2015    Family History  Problem Relation Age of Onset  . Asthma Neg Hx   . Cancer Neg Hx   . Diabetes Neg Hx   . Hearing loss Neg Hx   . Heart disease Neg Hx   . Hypertension Neg Hx   . Stroke Neg Hx     Social History  Substance Use Topics  . Smoking status: Former Smoker -- 0.10 packs/day for 10 years    Types: Cigarettes  . Smokeless tobacco: Never Used     Comment: quit with preg  . Alcohol Use: Yes     Comment: none with preg    Allergies: No Known Allergies  Prescriptions prior to admission  Medication Sig Dispense Refill Last Dose  . amoxicillin-clavulanate (AUGMENTIN) 875-125 MG per tablet Take 1 tablet by mouth once.   12/31/2013 at Unknown time  . Diethylpropion HCl (TENUATE DOSPAN PO) Take 75 mg by mouth daily.   12/31/2013 at Unknown time  . HYDROcodone-acetaminophen (NORCO/VICODIN) 5-325 MG per tablet Take 1-2 tablets by mouth every 4 (four) hours as needed. 20 tablet 0   . ibuprofen (ADVIL,MOTRIN) 200 MG tablet Take 600 mg by mouth every 6 (six) hours as needed for moderate pain.   12/31/2013 at Unknown time    Review of Systems  Constitutional: Negative for fever and chills.  Eyes: Negative for blurred vision.   Cardiovascular: Positive for leg swelling.  Gastrointestinal: Positive for heartburn. Negative for nausea, vomiting, abdominal pain, diarrhea and constipation.       ?heartburn; vs baby pushing on rib  Genitourinary: Negative for dysuria and urgency.  Musculoskeletal: Positive for back pain.  Neurological: Negative for dizziness and headaches.   Physical Exam   Blood pressure 122/77, pulse 68, temperature 98.3 F (36.8 C), resp. rate 18, height 5\' 3"  (1.6 m), weight 208 lb 6.4 oz (94.53 kg), last menstrual period 01/06/2015.  Physical Exam  Vitals reviewed. Constitutional: She is oriented to person, place, and time. She appears well-developed and well-nourished. No distress.  HENT:  Head: Normocephalic.  Eyes: Pupils are equal, round, and reactive to light.  Neck: Normal range of motion. Neck supple.  Respiratory: Effort normal.  GI: Soft. She exhibits no distension. There is no tenderness. There is no rebound.  Musculoskeletal: Normal range of motion. She exhibits edema.  Legs bilaterally  Neurological: She is alert and oriented to person, place, and time.  Skin: Skin is warm and dry.  Psychiatric: She has a normal mood and affect.    MAU Course  Procedures Results for orders placed or performed during the hospital encounter of 09/10/15 (from the past 24 hour(s))  Protein / creatinine ratio, urine  Status: None   Collection Time: 09/10/15  6:05 PM  Result Value Ref Range   Creatinine, Urine 130.00 mg/dL   Total Protein, Urine 16 mg/dL   Protein Creatinine Ratio 0.12 0.00 - 0.15 mg/mg[Cre]  CBC     Status: Abnormal   Collection Time: 09/10/15  7:05 PM  Result Value Ref Range   WBC 8.0 4.0 - 10.5 K/uL   RBC 3.36 (L) 3.87 - 5.11 MIL/uL   Hemoglobin 11.0 (L) 12.0 - 15.0 g/dL   HCT 16.1 (L) 09.6 - 04.5 %   MCV 92.0 78.0 - 100.0 fL   MCH 32.7 26.0 - 34.0 pg   MCHC 35.6 30.0 - 36.0 g/dL   RDW 40.9 81.1 - 91.4 %   Platelets 156 150 - 400 K/uL  Comprehensive metabolic  panel     Status: Abnormal   Collection Time: 09/10/15  7:05 PM  Result Value Ref Range   Sodium 135 135 - 145 mmol/L   Potassium 4.1 3.5 - 5.1 mmol/L   Chloride 109 101 - 111 mmol/L   CO2 20 (L) 22 - 32 mmol/L   Glucose, Bld 65 65 - 99 mg/dL   BUN 8 6 - 20 mg/dL   Creatinine, Ser 7.82 0.44 - 1.00 mg/dL   Calcium 8.3 (L) 8.9 - 10.3 mg/dL   Total Protein 6.0 (L) 6.5 - 8.1 g/dL   Albumin 2.4 (L) 3.5 - 5.0 g/dL   AST 38 15 - 41 U/L   ALT 29 14 - 54 U/L   Alkaline Phosphatase 158 (H) 38 - 126 U/L   Total Bilirubin 0.6 0.3 - 1.2 mg/dL   GFR calc non Af Amer >60 >60 mL/min   GFR calc Af Amer >60 >60 mL/min   Anion gap 6 5 - 15   Filed Vitals:   09/10/15 1915 09/10/15 1930  BP: 128/76 119/79  Pulse: 63 66  Temp:    Resp:     Filed Vitals:   09/10/15 1915 09/10/15 1930  BP: 128/76 119/79  Pulse: 63 66  Temp:    Resp:    discussed with Dr. Chestine Spore Pt may d/c home and f/u in office with scheduled appointment NST reactive 2 fetuses Assessment and Plan  Hypertension in pregnancy third trimester Twin pregnancy Reactive NSTs F/u in office with Dr. Henderson Cloud as scheduled  [facility] 09/10/2015, 6:54 PM

## 2015-09-10 NOTE — MAU Note (Signed)
Sent from office for Pre-eclampsia eval.  First time BP was up. 8# wt gain this week, increase swelling below knee

## 2015-09-10 NOTE — MAU Note (Signed)
Pt sent from office for elevated b/p and swelling. Denies headache or vision changes.

## 2015-09-13 ENCOUNTER — Encounter (HOSPITAL_COMMUNITY): Payer: Self-pay | Admitting: *Deleted

## 2015-09-13 ENCOUNTER — Inpatient Hospital Stay (HOSPITAL_COMMUNITY): Payer: BLUE CROSS/BLUE SHIELD | Admitting: Anesthesiology

## 2015-09-13 ENCOUNTER — Encounter (HOSPITAL_COMMUNITY)
Admission: AD | Disposition: A | Discharge status: Home / Self Care | Payer: Self-pay | Source: Ambulatory Visit | Attending: Obstetrics and Gynecology

## 2015-09-13 ENCOUNTER — Inpatient Hospital Stay (HOSPITAL_COMMUNITY)
Admission: AD | Admit: 2015-09-13 | Discharge: 2015-09-15 | DRG: 775 | Disposition: A | Discharge status: Home / Self Care | Payer: BLUE CROSS/BLUE SHIELD | Source: Ambulatory Visit | Attending: Obstetrics and Gynecology | Admitting: Obstetrics and Gynecology

## 2015-09-13 DIAGNOSIS — O09529 Supervision of elderly multigravida, unspecified trimester: Secondary | ICD-10-CM

## 2015-09-13 DIAGNOSIS — O134 Gestational [pregnancy-induced] hypertension without significant proteinuria, complicating childbirth: Principal | ICD-10-CM | POA: Diagnosis present

## 2015-09-13 DIAGNOSIS — Z3A35 35 weeks gestation of pregnancy: Secondary | ICD-10-CM

## 2015-09-13 DIAGNOSIS — O42913 Preterm premature rupture of membranes, unspecified as to length of time between rupture and onset of labor, third trimester: Secondary | ICD-10-CM | POA: Diagnosis present

## 2015-09-13 DIAGNOSIS — Z6836 Body mass index (BMI) 36.0-36.9, adult: Secondary | ICD-10-CM | POA: Diagnosis not present

## 2015-09-13 DIAGNOSIS — O99214 Obesity complicating childbirth: Secondary | ICD-10-CM | POA: Diagnosis present

## 2015-09-13 DIAGNOSIS — O30043 Twin pregnancy, dichorionic/diamniotic, third trimester: Secondary | ICD-10-CM | POA: Diagnosis present

## 2015-09-13 DIAGNOSIS — Z87891 Personal history of nicotine dependence: Secondary | ICD-10-CM | POA: Diagnosis not present

## 2015-09-13 DIAGNOSIS — O4290 Premature rupture of membranes, unspecified as to length of time between rupture and onset of labor, unspecified weeks of gestation: Secondary | ICD-10-CM | POA: Diagnosis not present

## 2015-09-13 DIAGNOSIS — E669 Obesity, unspecified: Secondary | ICD-10-CM | POA: Diagnosis present

## 2015-09-13 LAB — COMPREHENSIVE METABOLIC PANEL
ALBUMIN: 2.4 g/dL — AB (ref 3.5–5.0)
ALT: 37 U/L (ref 14–54)
ANION GAP: 8 (ref 5–15)
AST: 50 U/L — ABNORMAL HIGH (ref 15–41)
Alkaline Phosphatase: 175 U/L — ABNORMAL HIGH (ref 38–126)
BUN: 10 mg/dL (ref 6–20)
CHLORIDE: 107 mmol/L (ref 101–111)
CO2: 21 mmol/L — AB (ref 22–32)
Calcium: 8.3 mg/dL — ABNORMAL LOW (ref 8.9–10.3)
Creatinine, Ser: 0.69 mg/dL (ref 0.44–1.00)
GFR calc non Af Amer: >60 mL/min (ref >60)
GLUCOSE: 84 mg/dL (ref 65–99)
POTASSIUM: 4.3 mmol/L (ref 3.5–5.1)
SODIUM: 136 mmol/L (ref 135–145)
Total Bilirubin: 0.5 mg/dL (ref 0.3–1.2)
Total Protein: 6.1 g/dL — ABNORMAL LOW (ref 6.5–8.1)

## 2015-09-13 LAB — URINALYSIS, ROUTINE W REFLEX MICROSCOPIC
BILIRUBIN URINE: NEGATIVE
Glucose, UA: NEGATIVE mg/dL
HGB URINE DIPSTICK: NEGATIVE
Ketones, ur: NEGATIVE mg/dL
NITRITE: NEGATIVE
PROTEIN: NEGATIVE mg/dL
SPECIFIC GRAVITY, URINE: 1.025 (ref 1.005–1.030)
pH: 5.5 (ref 5.0–8.0)

## 2015-09-13 LAB — CBC
HCT: 31.2 % — ABNORMAL LOW (ref 36.0–46.0)
Hemoglobin: 11.2 g/dL — ABNORMAL LOW (ref 12.0–15.0)
MCH: 32.6 pg (ref 26.0–34.0)
MCHC: 35.9 g/dL (ref 30.0–36.0)
MCV: 90.7 fL (ref 78.0–100.0)
PLATELETS: 165 10*3/uL (ref 150–400)
RBC: 3.44 MIL/uL — ABNORMAL LOW (ref 3.87–5.11)
RDW: 13.3 % (ref 11.5–15.5)
WBC: 10.4 10*3/uL (ref 4.0–10.5)

## 2015-09-13 LAB — PROTEIN / CREATININE RATIO, URINE
Creatinine, Urine: 207 mg/dL
PROTEIN CREATININE RATIO: 0.14 mg/mg{creat} (ref 0.00–0.15)
Total Protein, Urine: 30 mg/dL

## 2015-09-13 LAB — PREPARE RBC (CROSSMATCH)

## 2015-09-13 LAB — URINE MICROSCOPIC-ADD ON

## 2015-09-13 LAB — POCT FERN TEST: POCT FERN TEST: POSITIVE

## 2015-09-13 SURGERY — Surgical Case
Anesthesia: Regional

## 2015-09-13 MED ORDER — LACTATED RINGERS IV SOLN: 500.0000 mL | INTRAVENOUS | Status: DC | PRN

## 2015-09-13 MED ORDER — DIPHENHYDRAMINE HCL 50 MG/ML IJ SOLN: 12.5000 mg | INTRAMUSCULAR | Status: DC | PRN

## 2015-09-13 MED ORDER — ONDANSETRON HCL 4 MG/2ML IJ SOLN: 4.0000 mg | Freq: Four times a day (QID) | INTRAMUSCULAR | Status: DC | PRN

## 2015-09-13 MED ORDER — SODIUM CHLORIDE 0.9 % IV SOLN: Freq: Once | INTRAVENOUS | Status: DC | PRN

## 2015-09-13 MED ORDER — LACTATED RINGERS IV SOLN
INTRAVENOUS | Status: DC
  Administered 2015-09-13 (×2): via INTRAVENOUS

## 2015-09-13 MED ORDER — FENTANYL 2.5 MCG/ML BUPIVACAINE 1/10 % EPIDURAL INFUSION (WH - ANES)
14.0000 mL/h | INTRAMUSCULAR | Status: DC | PRN
  Administered 2015-09-13: 14 mL/h via EPIDURAL
  Filled 2015-09-13: qty 125

## 2015-09-13 MED ORDER — PENICILLIN G POTASSIUM 5000000 UNITS IJ SOLR
5.0000 10*6.[IU] | Freq: Once | INTRAVENOUS | Status: AC
  Administered 2015-09-13: 5 10*6.[IU] via INTRAVENOUS
  Filled 2015-09-13: qty 5

## 2015-09-13 MED ORDER — LACTATED RINGERS IV SOLN
2.5000 [IU]/h | INTRAVENOUS | Status: DC
Start: 2015-09-13 — End: 2015-09-13

## 2015-09-13 MED ORDER — OXYTOCIN BOLUS FROM INFUSION
500.0000 mL | INTRAVENOUS | Status: DC
  Administered 2015-09-13: 500 mL via INTRAVENOUS

## 2015-09-13 MED ORDER — PHENYLEPHRINE 40 MCG/ML (10ML) SYRINGE FOR IV PUSH (FOR BLOOD PRESSURE SUPPORT): 80.0000 ug | PREFILLED_SYRINGE | INTRAVENOUS | Status: DC | PRN

## 2015-09-13 MED ORDER — OXYCODONE-ACETAMINOPHEN 5-325 MG PO TABS: 2.0000 | ORAL_TABLET | ORAL | Status: DC | PRN

## 2015-09-13 MED ORDER — FLEET ENEMA 7-19 GM/118ML RE ENEM: 1.0000 | ENEMA | RECTAL | Status: DC | PRN

## 2015-09-13 MED ORDER — LIDOCAINE HCL (PF) 1 % IJ SOLN: 30.0000 mL | INTRAMUSCULAR | Status: DC | PRN

## 2015-09-13 MED ORDER — LACTATED RINGERS IV SOLN
INTRAVENOUS | Status: DC | PRN
  Administered 2015-09-13: 23:00:00 via INTRAVENOUS

## 2015-09-13 MED ORDER — LACTATED RINGERS IV SOLN: 500.0000 mL | Freq: Once | INTRAVENOUS | Status: DC

## 2015-09-13 MED ORDER — METHYLERGONOVINE MALEATE 0.2 MG/ML IJ SOLN
0.2000 mg | Freq: Once | INTRAMUSCULAR | Status: AC
  Administered 2015-09-13: 0.2 mg via INTRAMUSCULAR

## 2015-09-13 MED ORDER — OXYTOCIN 10 UNIT/ML IJ SOLN
1.0000 m[IU]/min | INTRAMUSCULAR | Status: DC
  Administered 2015-09-13: 2 m[IU]/min via INTRAVENOUS
  Filled 2015-09-13: qty 10

## 2015-09-13 MED ORDER — TERBUTALINE SULFATE 1 MG/ML IJ SOLN: 0.2500 mg | Freq: Once | INTRAMUSCULAR | Status: DC | PRN

## 2015-09-13 MED ORDER — ACETAMINOPHEN 325 MG PO TABS: 650.0000 mg | ORAL_TABLET | ORAL | Status: DC | PRN

## 2015-09-13 MED ORDER — LIDOCAINE HCL 1 % IJ SOLN
INTRAMUSCULAR | Status: AC
  Filled 2015-09-13: qty 20

## 2015-09-13 MED ORDER — OXYCODONE-ACETAMINOPHEN 5-325 MG PO TABS
1.0000 | ORAL_TABLET | ORAL | Status: DC | PRN
Start: 2015-09-13 — End: 2015-09-13

## 2015-09-13 MED ORDER — EPHEDRINE 5 MG/ML INJ: 10.0000 mg | INTRAVENOUS | Status: DC | PRN

## 2015-09-13 MED ORDER — PHENYLEPHRINE 40 MCG/ML (10ML) SYRINGE FOR IV PUSH (FOR BLOOD PRESSURE SUPPORT)
80.0000 ug | PREFILLED_SYRINGE | INTRAVENOUS | Status: DC | PRN
  Filled 2015-09-13: qty 20

## 2015-09-13 MED ORDER — CITRIC ACID-SODIUM CITRATE 334-500 MG/5ML PO SOLN
30.0000 mL | ORAL | Status: DC | PRN
  Filled 2015-09-13: qty 15

## 2015-09-13 MED ORDER — LIDOCAINE HCL (PF) 1 % IJ SOLN
INTRAMUSCULAR | Status: DC | PRN
  Administered 2015-09-13: 2 mL via EPIDURAL
  Administered 2015-09-13: 3 mL via EPIDURAL
  Administered 2015-09-13: 5 mL via EPIDURAL

## 2015-09-13 MED ORDER — DEXTROSE 5 % IV SOLN
2.5000 10*6.[IU] | INTRAVENOUS | Status: DC
  Administered 2015-09-13 (×2): 2.5 10*6.[IU] via INTRAVENOUS
  Filled 2015-09-13 (×3): qty 2.5

## 2015-09-13 NOTE — H&P (Signed)
35 y.o. 6160w5d  G2P0010 comes in c/o SROM this am.  Otherwise has good fetal movement and no bleeding.  Di/di twins, last US showing vtx/transverse with 5% discordance, baby A>B.  Past Medical History  Diagnosis Date  . Leg fracture, left   . Ankle fracture, right   . Anxiety   . PCOS (polycystic ovarian syndrome)   . Vaginal Pap smear, abnormal     Past Surgical History  Procedure Laterality Date  . Wisdom tooth extraction    . Bartholin cyst marsupialization Bilateral 2015    OB History  Gravida Para Term Preterm AB SAB TAB Ectopic Multiple Living  2 0   1  1   0    # Outcome Date GA Lbr Len/2nd Weight Sex Delivery Anes PTL Lv  2 Current           1 TAB               Social History   Social History  . Marital Status: Married    Spouse Name: N/A  . Number of Children: N/A  . Years of Education: N/A   Occupational History  . Not on file.   Social History Main Topics  . Smoking status: Former Smoker -- 0.10 packs/day for 10 years    Types: Cigarettes  . Smokeless tobacco: Never Used     Comment: quit with preg  . Alcohol Use: Yes     Comment: none with preg  . Drug Use: 1.00 per week    Special: Cocaine, Marijuana     Comment: quit with preg  . Sexual Activity: Not on file   Other Topics Concern  . Not on file   Social History Narrative   Review of patient's allergies indicates no known allergies.    Prenatal Transfer Tool  Maternal Diabetes: No Genetic Screening: Abnormal:  Results: Other:  Thickened NT initially, NIPT low risk Maternal Ultrasounds/Referrals: Abnormal:  Findings:   Other: marginal cord insertion baby B Fetal Ultrasounds or other Referrals:  None Maternal Substance Abuse:  No Significant Maternal Medications: Zoloft ( pt initially on Ativan and weened off) Significant Maternal Lab Results: Lab values include: Other:   GBS done in office but not resulted  Other PNC: elevated BPs in office 09/10/2015 - labs normal    Filed Vitals:   09/13/15 1003 09/13/15 1018  BP: 131/86 129/77  Pulse: 71 68  Temp:    Resp:       Lungs/Cor:  NAD Abdomen:  soft, gravid Ex:  no cords, erythema SVE:  1.5/80/-2 FHTs:  130 x 2, good STV, NST R Toco:  q 1.5-4   A/P   Admit di/di twins with SROM 35.5  Baby A vtx, baby B last US transverse.  Discussed with Dr. Despina HiddenEure of faculty practice, he agrees to assist with delivery of baby B given my limited experience of non vertex deliveries  GBS unknown - will start PCN  Epidural when desired  Will start pitocin 2x2  Angela George

## 2015-09-13 NOTE — MAU Note (Signed)
Pt states she woke up and her water broke around 0650 this morning.  She had one contraction on the way her and another one signing in but none that were very strong and regular.  Pt states she has worn one pad and took a shower this morning to get rid of the fluid that had run down her legs.  She states it ran down her legs a few time before she took a shower.  She said the first time the fluid was mixed with bloody but then it was clear with a slight pink color.  Pt denies any smell to the fluid.

## 2015-09-13 NOTE — Anesthesia Preprocedure Evaluation (Addendum)
Anesthesia Evaluation  Patient identified by MRN, date of birth, ID band Patient awake    Reviewed: Allergy & Precautions, NPO status , Patient's Chart, lab work & pertinent test results  Airway Mallampati: I  TM Distance: >3 FB Neck ROM: Full    Dental  (+) Teeth Intact, Dental Advisory Given   Pulmonary former smoker,    Pulmonary exam normal breath sounds clear to auscultation       Cardiovascular negative cardio ROS Normal cardiovascular exam Rhythm:Regular Rate:Normal     Neuro/Psych PSYCHIATRIC DISORDERS Anxiety negative neurological ROS     GI/Hepatic negative GI ROS, Neg liver ROS,   Endo/Other  Obesity   Renal/GU negative Renal ROS     Musculoskeletal negative musculoskeletal ROS (+)   Abdominal   Peds  Hematology  (+) Blood dyscrasia, anemia , Plt 165k   Anesthesia Other Findings Day of surgery medications reviewed with the patient.  Reproductive/Obstetrics (+) Pregnancy 35 year old female at 35 weeks 5 days with DI/TI twins, rupture of membranes in early labor                            Anesthesia Physical Anesthesia Plan  ASA: II  Anesthesia Plan: Epidural   Post-op Pain Management:    Induction:   Airway Management Planned:   Additional Equipment:   Intra-op Plan:   Post-operative Plan:   Informed Consent: I have reviewed the patients History and Physical, chart, labs and discussed the procedure including the risks, benefits and alternatives for the proposed anesthesia with the patient or authorized representative who has indicated his/her understanding and acceptance.   Dental advisory given  Plan Discussed with:   Anesthesia Plan Comments: (Patient identified. Risks/Benefits/Options discussed with patient including but not limited to bleeding, infection, nerve damage, paralysis, failed block, incomplete pain control, headache, blood pressure changes, nausea,  vomiting, reactions to medication both or allergic, itching and postpartum back pain. Confirmed with bedside nurse the patient's most recent platelet count. Confirmed with patient that they are not currently taking any anticoagulation, have any bleeding history or any family history of bleeding disorders. Patient expressed understanding and wished to proceed. All questions were answered. )        Anesthesia Quick Evaluation

## 2015-09-13 NOTE — MAU Provider Note (Signed)
Chief Complaint:  Rupture of Membranes   First Provider Initiated Contact with Patient 09/13/15 1033     HPI: Angela George is a 35 y.o. G2P0010 at [redacted]w[redacted]d who presents to maternity admissions reporting multiple episodes of leaking clear fluid since 6:50 AM and mild contractions..  Location: Low abdomen Quality: Cramping Severity: Mild Duration: 1-2 hours Context: After water broke Course: Worsening Timing: Intermittent Modifying factors: Hasn't tried anything Associated signs and symptoms: Positive for leaking fluid. Negative for fever, chills, vaginal bleeding.  Good fetal movement. Denies headache, vision changes or epigastric pain.  Pregnancy Course: DI/DI twins. Increase NT 2 with normal cell-free DNA testing. Elevated blood pressure last week. Normal preeclampsia lab work.  Past Medical History: Past Medical History  Diagnosis Date  . Leg fracture, left   . Ankle fracture, right   . Anxiety   . PCOS (polycystic ovarian syndrome)   . Vaginal Pap smear, abnormal     Past obstetric history: OB History  Gravida Para Term Preterm AB SAB TAB Ectopic Multiple Living  2 0   1  1   0    # Outcome Date GA Lbr Len/2nd Weight Sex Delivery Anes PTL Lv  2 Current           1 TAB               Past Surgical History: Past Surgical History  Procedure Laterality Date  . Wisdom tooth extraction    . Bartholin cyst marsupialization Bilateral 2015     Family History: Family History  Problem Relation Age of Onset  . Asthma Neg Hx   . Cancer Neg Hx   . Diabetes Neg Hx   . Hearing loss Neg Hx   . Heart disease Neg Hx   . Hypertension Neg Hx   . Stroke Neg Hx     Social History: Social History  Substance Use Topics  . Smoking status: Former Smoker -- 0.10 packs/day for 10 years    Types: Cigarettes  . Smokeless tobacco: Never Used     Comment: quit with preg  . Alcohol Use: Yes     Comment: none with preg    Allergies: No Known Allergies  Meds:  Prescriptions  prior to admission  Medication Sig Dispense Refill Last Dose  . folic acid (FOLVITE) 1 MG tablet Take 1 mg by mouth daily.   Past Week at Unknown time  . Prenatal Vit-Fe Fumarate-FA (PRENATAL MULTIVITAMIN) TABS tablet Take 1 tablet by mouth daily at 12 noon.   Past Week at Unknown time  . sertraline (ZOLOFT) 25 MG tablet Take 25 mg by mouth daily.   Past Week at Unknown time    I have reviewed patient's Past Medical Hx, Surgical Hx, Family Hx, Social Hx, medications and allergies.   ROS:  Review of Systems  Constitutional: Negative for fever and chills.  Eyes: Negative for visual disturbance.  Gastrointestinal: Positive for abdominal pain.  Genitourinary: Negative for vaginal bleeding.       Positive for leaking of fluid.  Neurological: Negative for headaches.    Physical Exam   Patient Vitals for the past 24 hrs:  BP Temp Temp src Pulse Resp  09/13/15 1018 129/77 mmHg - - 68 -  09/13/15 1003 131/86 mmHg - - 71 -  09/13/15 0959 133/90 mmHg - - 81 -  09/13/15 0957 133/90 mmHg 97.8 F (36.6 C) Oral 81 16   Constitutional: Well-developed, well-nourished female in no acute distress.  Cardiovascular: normal rate  Respiratory: normal effort GI: Abd soft, non-tender, gravid size>dates.  MS: Extremities nontender, no edema, normal ROM Neurologic: Alert and oriented x 4.  GU: Neg CVAT.  Pelvic: NEFG, physiologic discharge, no blood, cervix clean. No CMT  Dilation: 1.5 Effacement (%): 80 Station: -2 Presentation: Vertex baby A Exam by:: Dorathy KinsmanVirginia Santonio Speakman, CNM  FHT:   Baby A: Baseline 135 , moderate variability, 15x15 accelerations present, no decelerations  Baby B: Baseline 135 , moderate variability, 15x15 ccelerations present, no decelerations Contractions: q 2-3 mins, mild-mod   Labs: Results for orders placed or performed during the hospital encounter of 09/13/15 (from the past 24 hour(s))  Urinalysis, Routine w reflex microscopic (not at [facility])     Status: Abnormal    Collection Time: 09/13/15  9:45 AM  Result Value Ref Range   Color, Urine YELLOW YELLOW   APPearance CLEAR CLEAR   Specific Gravity, Urine 1.025 1.005 - 1.030   pH 5.5 5.0 - 8.0   Glucose, UA NEGATIVE NEGATIVE mg/dL   Hgb urine dipstick NEGATIVE NEGATIVE   Bilirubin Urine NEGATIVE NEGATIVE   Ketones, ur NEGATIVE NEGATIVE mg/dL   Protein, ur NEGATIVE NEGATIVE mg/dL   Nitrite NEGATIVE NEGATIVE   Leukocytes, UA TRACE (A) NEGATIVE  Urine microscopic-add on     Status: Abnormal   Collection Time: 09/13/15  9:45 AM  Result Value Ref Range   Squamous Epithelial / LPF 0-5 (A) NONE SEEN   WBC, UA 0-5 0 - 5 WBC/hpf   RBC / HPF 0-5 0 - 5 RBC/hpf   Bacteria, UA RARE (A) NONE SEEN  Fern Test     Status: Abnormal   Collection Time: 09/13/15 10:34 AM  Result Value Ref Range   POCT Fern Test Positive = ruptured amniotic membanes     Imaging:  No results found.  MAU Course: Fern positive.  MDM: 35 year old female at 35 weeks 5 days with DI/TI twins, rupture of membranes in early labor. GBS pending. We'll treat as unknown.  Assessment: 1. Labor: Early/PROM 2. Fetal Wellbeing: Category 1  3. Pain Control: None 4. GBS: Unknown 5. 35.5 week IUP 6. DI/DI twins with Vertex baby A 7. Gestational hypertension  Plan:  1. Admit to BS per consult with Dr. Claiborne Billingsallahan. 2. Routine L&D orders 3. Epidural PRN  4. Pitocin augmentation. 5. Preeclampsia labs. 6. Penicillin for unknown GBS.  FayetteVirginia Monserrath Junio, CNM 09/13/2015 11:18 AM

## 2015-09-13 NOTE — Anesthesia Procedure Notes (Signed)

## 2015-09-14 ENCOUNTER — Encounter (HOSPITAL_COMMUNITY): Payer: Self-pay

## 2015-09-14 LAB — CBC
HEMATOCRIT: 29.6 % — AB (ref 36.0–46.0)
Hemoglobin: 10.7 g/dL — ABNORMAL LOW (ref 12.0–15.0)
MCH: 32.8 pg (ref 26.0–34.0)
MCHC: 36.1 g/dL — AB (ref 30.0–36.0)
MCV: 90.8 fL (ref 78.0–100.0)
PLATELETS: 125 10*3/uL — AB (ref 150–400)
RBC: 3.26 MIL/uL — ABNORMAL LOW (ref 3.87–5.11)
RDW: 13.4 % (ref 11.5–15.5)
WBC: 19.5 10*3/uL — AB (ref 4.0–10.5)

## 2015-09-14 LAB — RPR: RPR: NONREACTIVE

## 2015-09-14 MED ORDER — OXYCODONE-ACETAMINOPHEN 5-325 MG PO TABS
1.0000 | ORAL_TABLET | ORAL | Status: DC | PRN
  Administered 2015-09-15: 1 via ORAL
  Filled 2015-09-14: qty 1

## 2015-09-14 MED ORDER — RHO D IMMUNE GLOBULIN 1500 UNIT/2ML IJ SOSY
300.0000 ug | PREFILLED_SYRINGE | Freq: Once | INTRAMUSCULAR | Status: DC
  Filled 2015-09-14: qty 2

## 2015-09-14 MED ORDER — LANOLIN HYDROUS EX OINT: TOPICAL_OINTMENT | CUTANEOUS | Status: DC | PRN

## 2015-09-14 MED ORDER — RHO D IMMUNE GLOBULIN 1500 UNIT/2ML IJ SOSY
300.0000 ug | PREFILLED_SYRINGE | Freq: Once | INTRAMUSCULAR | Status: AC
  Administered 2015-09-14: 300 ug via INTRAMUSCULAR
  Filled 2015-09-14: qty 2

## 2015-09-14 MED ORDER — WITCH HAZEL-GLYCERIN EX PADS
1.0000 "application " | MEDICATED_PAD | CUTANEOUS | Status: DC | PRN
  Administered 2015-09-14: 1 via TOPICAL

## 2015-09-14 MED ORDER — ONDANSETRON HCL 4 MG PO TABS: 4.0000 mg | ORAL_TABLET | ORAL | Status: DC | PRN

## 2015-09-14 MED ORDER — ONDANSETRON HCL 4 MG/2ML IJ SOLN: 4.0000 mg | INTRAMUSCULAR | Status: DC | PRN

## 2015-09-14 MED ORDER — DIBUCAINE 1 % RE OINT: 1.0000 "application " | TOPICAL_OINTMENT | RECTAL | Status: DC | PRN

## 2015-09-14 MED ORDER — TETANUS-DIPHTH-ACELL PERTUSSIS 5-2.5-18.5 LF-MCG/0.5 IM SUSP: 0.5000 mL | Freq: Once | INTRAMUSCULAR | Status: DC

## 2015-09-14 MED ORDER — SIMETHICONE 80 MG PO CHEW: 80.0000 mg | CHEWABLE_TABLET | ORAL | Status: DC | PRN

## 2015-09-14 MED ORDER — PRENATAL MULTIVITAMIN CH
1.0000 | ORAL_TABLET | Freq: Every day | ORAL | Status: DC
  Administered 2015-09-14 – 2015-09-15 (×2): 1 via ORAL
  Filled 2015-09-14 (×2): qty 1

## 2015-09-14 MED ORDER — CITRIC ACID-SODIUM CITRATE 334-500 MG/5ML PO SOLN
30.0000 mL | Freq: Once | ORAL | Status: AC
Start: 2015-09-13 — End: 2015-09-13
  Administered 2015-09-13: 30 mL via ORAL

## 2015-09-14 MED ORDER — ZOLPIDEM TARTRATE 5 MG PO TABS: 5.0000 mg | ORAL_TABLET | Freq: Every evening | ORAL | Status: DC | PRN

## 2015-09-14 MED ORDER — DIPHENHYDRAMINE HCL 25 MG PO CAPS: 25.0000 mg | ORAL_CAPSULE | Freq: Four times a day (QID) | ORAL | Status: DC | PRN

## 2015-09-14 MED ORDER — SENNOSIDES-DOCUSATE SODIUM 8.6-50 MG PO TABS
2.0000 | ORAL_TABLET | ORAL | Status: DC
  Administered 2015-09-14: 2 via ORAL
  Filled 2015-09-14: qty 2

## 2015-09-14 MED ORDER — OXYCODONE-ACETAMINOPHEN 5-325 MG PO TABS: 2.0000 | ORAL_TABLET | ORAL | Status: DC | PRN

## 2015-09-14 MED ORDER — IBUPROFEN 600 MG PO TABS
600.0000 mg | ORAL_TABLET | Freq: Four times a day (QID) | ORAL | Status: DC
  Administered 2015-09-14 – 2015-09-15 (×5): 600 mg via ORAL
  Filled 2015-09-14 (×6): qty 1

## 2015-09-14 MED ORDER — ACETAMINOPHEN 325 MG PO TABS
650.0000 mg | ORAL_TABLET | ORAL | Status: DC | PRN
  Administered 2015-09-14 – 2015-09-15 (×2): 650 mg via ORAL
  Filled 2015-09-14 (×2): qty 2

## 2015-09-14 MED ORDER — BENZOCAINE-MENTHOL 20-0.5 % EX AERO
1.0000 "application " | INHALATION_SPRAY | CUTANEOUS | Status: DC | PRN
  Administered 2015-09-14: 1 via TOPICAL
  Filled 2015-09-14: qty 56

## 2015-09-14 NOTE — Anesthesia Postprocedure Evaluation (Signed)
Anesthesia Post Note  Patient: Angela George  Procedure(s) Performed: * No procedures listed *  Patient location during evaluation: Mother Baby Anesthesia Type: Epidural Level of consciousness: awake and alert Pain management: pain level controlled Vital Signs Assessment: post-procedure vital signs reviewed and stable Respiratory status: spontaneous breathing, nonlabored ventilation and respiratory function stable Cardiovascular status: stable Postop Assessment: no headache, no backache and epidural receding Anesthetic complications: no Comments: Current pain 5    Last Vitals:  Filed Vitals:   09/14/15 0338 09/14/15 0800  BP: 132/66 121/67  Pulse: 74 55  Temp: 36.8 C 36.6 C  Resp: 18 18    Last Pain:  Filed Vitals:   09/14/15 1040  PainSc: 1                  Martrice Apt

## 2015-09-14 NOTE — Lactation Note (Signed)
This note was copied from a baby's chart. Lactation Consultation Note RN requested IBCLC speak to mom to see if she had any other questions. Hand expression reviewed and 3 ml of colostrum was expressed. DEBP also reviewed.  Feeding plan discussed. Mom is very open to Angela George using formula for supplementation if she does not have milk available. Asked mom to call for latch check at the next feeding,  Patient Name: Angela George ZOXWR'UToday's Date: 09/14/2015 Reason for consult: Follow-up assessment   Maternal Data Has patient been taught Hand Expression?: Yes Does the patient have breastfeeding experience prior to this delivery?: No  Feeding Feeding Type: Breast Fed Length of feed: 15 min  LATCH Score/Interventions                      Lactation Tools Discussed/Used Pump Review: Setup, frequency, and cleaning;Milk Storage Initiated by:: RN Date initiated:: 09/14/15   Consult Status Consult Status: Follow-up Date: 09/14/15 Follow-up type: In-patient    Angela George, Angela George 09/14/2015, 6:01 PM

## 2015-09-14 NOTE — Progress Notes (Signed)
Post Partum Day 1 Subjective: no complaints, up ad lib, voiding and tolerating PO  Objective: Blood pressure 132/66, pulse 74, temperature 98.3 F (36.8 C), temperature source Oral, resp. rate 18, height 5\' 3"  (1.6 m), weight 94.348 kg (208 lb), last menstrual period 01/06/2015, SpO2 96 %, unknown if currently breastfeeding.  Physical Exam:  General: alert, cooperative and appears stated age Lochia: appropriate Uterine Fundus: firm   Recent Labs  09/13/15 1109 09/14/15 0505  HGB 11.2* 10.7*  HCT 31.2* 29.6*    Assessment/Plan: Breastfeeding  Routine PP care Desires neonatal circumcision, R/B/A of procedure discussed at length. Pt understands that neonatal circumcision is not considered medically necessary and is elective. The risks include, but are not limited to bleeding, infection, damage to the penis, development of scar tissue, and having to have it redone at a later date. Pt understands theses risks and wishes to proceed. Baby 9 hours old. May need to defer circ until tomorrow given gestational age.    LOS: 1 day   Mekhi Sonn H. 09/14/2015, 9:23 AM

## 2015-09-14 NOTE — Lactation Note (Signed)
This note was copied from a baby's chart. Lactation Consultation Note:  Lactation Brochure given to mother with basic teaching. Mother was also given NICU brochure .  [name] was transferred to NICU with low blood sugar of 23. [name] is breastfeeding well and due to eat within 15-30 mins.  Mother is eating lunch and will page for assistance. Reviewed importance of collection of colostrum , milk storage and transportation.   Mother advised to breastfeed Baby A and then to post pump for Baby B. Mother very receptive to all teaching.  Mother has pump sat up at the bedside. She is to page when she is ready to pump and to feed infant. Mother was given Breastmilk labels and yellow colostrum dots.   Patient Name: Angela George Yenni Winkleman WJXBJ'YToday's Date: 09/14/2015 Reason for consult: Initial assessment   Maternal Data Has patient been taught Hand Expression?: Yes Does the patient have breastfeeding experience prior to this delivery?: No  Feeding    LATCH Score/Interventions                      Lactation Tools Discussed/Used     Consult Status Consult Status: Follow-up Date: 09/14/15 Follow-up type: In-patient    Stevan BornKendrick, [place]ardonnay Holzmann [facility] 09/14/2015, 3:41 PM

## 2015-09-14 NOTE — Progress Notes (Signed)
CLINICAL SOCIAL WORK MATERNAL/CHILD NOTE  Patient Details  Name: Angela George MRN: [id number] Date of Birth: 2017-07-29  Date:  12-21-2015  Clinical Social Worker Initiating Note:  Elissa Hefty, MSW intern  Date/ Time Initiated:  09/14/15/1140     Child's Name:  Angela George and Angela George    Legal Guardian:  York Cerise and Hilliard Clark    Need for Interpreter:  None   Date of Referral:  06/20/16     Reason for Referral:  Behavioral Health Issues, including SI , Current Substance Use/Substance Use During Pregnancy    Referral Source:  [facility]   Address:  [place], [place] [address]  Phone number:  [phone]   Household Members:  Self, Spouse   Natural Supports (not living in the home):  Immediate Family, Spouse/significant other, Parent   Professional Supports: Psychiatrist- Dr. Toy Care [phone] [address] [place], [place] [address]    Employment: Full-time   Type of Work:     Education:  Engineer, maintenance Resources:  Multimedia programmer   Other Resources:      Cultural/Religious Considerations Which May Impact Care:  None Reported   Strengths:  Ability to meet basic needs , Home prepared for child    Risk Factors/Current Problems:   Mental Health Concerns- [name] reported a history of anxiety and depression. However, [name] expressed she identifies more with her anxiety diagnosis and denied feeling depressed in a few years. [name] shared she was diagnosed about 4-5 years ago after her divorce with her ex-husband. Per [name], she is prescribed Zoloft (25 mg) by both her OB and Dr. Toy Care. [name] voiced she meets with Dr. Toy Care once a month for therapy as well.  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 [name] denied she has ever used substances and stated that the information is incorrect in her chart. Infants  UDS is negative.    Cognitive State:  Insightful , Linear  Thinking , Goal Oriented    Mood/Affect:  Happy , Interested , Calm , Bright , Overwhelmed    CSW Assessment:  MSW intern presented in patient's room due to a consult being placed by the central nursery because of a history of depression and anxiety along with substance use before finding out about the pregnancy. [name] was present in the room and [name] provided verbal consent for MSW intern to engage. [name] was breast feeding one of the infants and presented to be in a happy mood as evidence by her smiling and actively engaging during the assessment. [name] reported that the birthing process went well and she was transitioning well into postpartum. [name] shared she received an epidural but still was able able to feel pressure when she delivered the infants. [name] expressed she was happy with the overall experience and was glad her infants were healthy. [name] shared she was feeling overwhelmed because she had only slept two hours the night before and whenever she was getting ready to rest hospital staff came in the room. MSW intern acknowledged [name]'s feelings and empathized with her in regard to being in a hospital and the constant interruptions to assess her health and the infants. [name] voiced she was "happy" and knew it was all for good reason and would get some rest once [name]'s mother came to visit. [name] stated she has a great support system from [name] and both sides of their family. [name]  shared she has met all of the infant's basic needs and is prepared to go home. Per [name], she has the nursery set up but still has a lot of other things in boxes since the infants came 5 weeks early.   MSW intern inquired about [name]'s mental health during the pregnancy. [name] described her pregnancy to be "perfect" and denied having any mental health concerns. [name] stated that the only thing she had to complaint about was the last few weeks of being pregnant and not being able to get comfortable in order to get some rest.  MSW intern asked  [name] if there were mental health concerns prior to the pregnancy. [name] shared she suffered from anxiety and was prescribed Zoloft by her OB. MSW intern asked [name] if she ever suffered from depression as well. [name] reported that she suffered mild depression when she was first diagnosed about 4-5 years ago. According to [name], she was diagnosed after her divorce to her ex-husband and the family drama between his family and him. [name] denied feelings of depression during the pregnancy. Per [name], she was prescribed Ativan by her psychiatrist prior to the pregnancy but was switched to Zoloft once she found out she was pregnant. [name] reported her psychiatrist name to be, Dr. Toy Care. She shared she sees him once a month for therapy as well. According to [facility], she plans to stay on Zoloft since she is breastfeeding. [name] voiced Zoloft to be helpful with her anxiety. MSW intern asked [name] if her OB or psychiatrist had discussed perinatal mood disorders with her. [name] shared her OB briefly mentioned it to her and told her to reach out if needs arise. MSW intern provided education on perinatal mood disorders and the hospital's support group, Feelings After Birth. MSW intern also left [name] with a handout containing additional resources on the topic. [name] denied having any further questions or concerns but agreed to contact her OB if needs arise.    MSW intern asked [name] if she had utilized any substances during the or prior to the pregnancy. [name] and [name] both shared that the night nurse had brought up a concerns last night when they asked her why there were cotton balls in the infants diapers. [name] and [name] voiced they were told that in [name]'s chart there was a history of THC and cocaine use. However, [name] denied ever using substances and both reported that information was incorrect. [name] expressed he had raised a concern to the night nurse in regard to the correct information on [name]'s chart. [name] and [name] were both pleasant and calm when discussing  their concerns with MSW intern. MSW intern explained the hospital's drug screening policy and procedures. [name] and [name] both acknowledged they understood why the infants where drug screened but denied having any concerns. MSW intern made them aware of the infants negative UDS results. MSW intern acknowledged their concerns about the incorrect information and reassured them she would voice them to other staff. [name] and [name] thanked MSW intern for the information provided and clarity on the drug screens.   MSW intern asked [name] what she enjoyed doing and found helpful to de-stress. [name] shared she had learned several breathing techniques in therapy to help her cope with her anxiety. [name] shared she loved to listen to music and go out with [name] and their friends for drinks. MSW intern went over the importance of self- care and rest as well. [name] shared she will utilize the nice weather to take out  the infants for walks and get some fresh air for herself. [name] thanked MSW intern for stopping by and the information provided. [name] and [name] denied having any further questions or concernsbut agreed to contact MSW intern if needs arise.   CSW Plan/Description:   Engineer, mining- MSW intern provided education on perinatal mood disorders and the hospital's support group, Feelings After Birth.  MSW to monitor infants cord tissue drug screen and file a Child Protective Service Report as needed. (UDS is negative) No Further Intervention Required/No Barriers to Discharge    Trevor Iha, Student-SW September 10, 2015, 12:12 PM

## 2015-09-15 MED ORDER — OXYCODONE-ACETAMINOPHEN 5-325 MG PO TABS: 1.0000 | ORAL_TABLET | ORAL | Status: DC | PRN

## 2015-09-15 NOTE — Progress Notes (Signed)
PPD#2 Pt without complaints. Ready for discharge VSSAF IMP/ stable Plan/ discharge

## 2015-09-15 NOTE — Lactation Note (Signed)
This note was copied from a baby's chart. Lactation Consultation Note:  Mothers history states that she has PCOS. Mother is getting 3-5 ml of colostrum with pumping.  Mother has a hospital grade pump the Spectra 2 for home use.  Reviewed the Late Preterm infant parent instruction sheet with mother. Mother advised of importance to continue to breastfeed infant and then supplement infant with ebm/formula after each breastfeeding. Reviewed collection, storage and transportation of breastmilk.   Infant is in nursery for circumcision at this time. Advised mother that infant will be sleepy after procedure due to medications,but to attempt to feed every 2-3 hours with STS and body massage.  Mothers feeding plan is as follows, breastfeed then supplement . Mother verbalizes understanding of plan.  Mother to continue to hand express before and after each pumping . Mother to post pump after each feeding for 15-20 mins. . Mother advised to schedule a follow up with Lactation Services  for feeding assessment with Twins in outpatient dept within a few days after discharge. Mother to page to have feeding assessed when infant returns from circumcision.    Patient Name: Angela George ZOXWR'UToday's Date: 09/15/2015 Reason for consult: Follow-up assessment   Maternal Data    Feeding Feeding Type: Breast Fed Length of feed: 30 min  LATCH Score/Interventions                      Lactation Tools Discussed/Used     Consult Status Consult Status: Follow-up Date: 09/15/15 Follow-up type: In-patient    Angela BornKendrick, Angela George Angela George 09/15/2015, 9:22 AM

## 2015-09-15 NOTE — Discharge Summary (Signed)
Obstetric Discharge Summary Reason for Admission: rupture of membranes Prenatal Procedures: NST and ultrasound Intrapartum Procedures: spontaneous vaginal delivery Postpartum Procedures: none Complications-Operative and Postpartum: small vaginal laceration HEMOGLOBIN  Date Value Ref Range Status  09/14/2015 10.7* 12.0 - 15.0 g/dL Final   HGB  Date Value Ref Range Status  04/22/2014 13.3 12.0-16.0 g/dL Final   HCT  Date Value Ref Range Status  09/14/2015 29.6* 36.0 - 46.0 % Final    Physical Exam:  General: alert Lochia: appropriate Uterine Fundus: firm   Discharge Diagnoses: twin preg and  SROM  Discharge Information: Date: 09/15/2015 Activity: pelvic rest Diet: routine Medications: PNV, Ibuprofen and Percocet Condition: stable Instructions: refer to practice specific booklet Discharge to: home Follow-up Information    Follow up with CALLAHAN, SIDNEY, DO. Schedule an appointment as soon as possible for a visit in 1 month.   Specialty:  Obstetrics and Gynecology   Contact information:   [address] [place] [address] [phone]       Newborn Data:   Tollie EthBrooks, BoyA [place] [[id number]][[id number]]  Live born female  Birth Weight: 5 lb 6.6 oz (2455 g) APGAR: 8, 8   Filiberto PinksBrooks, GirlB [place] [[id number]][[id number]]  Live born female  Birth Weight: 4 lb 14.5 oz (2225 g) APGAR: 7, 8  Home with boy home with mom, girl in NICU.  Jeffrie Lofstrom E 09/15/2015, 8:54 AM

## 2015-09-16 ENCOUNTER — Ambulatory Visit: Payer: Self-pay

## 2015-09-16 LAB — TYPE AND SCREEN
ABO/RH(D): B NEG
Antibody Screen: POSITIVE
DAT, IgG: NEGATIVE
UNIT DIVISION: 0
UNIT DIVISION: 0

## 2015-09-16 LAB — RH IG WORKUP (INCLUDES ABO/RH)
ABO/RH(D): B NEG
Fetal Screen: NEGATIVE
Gestational Age(Wks): 35.5
UNIT DIVISION: 0

## 2015-09-16 NOTE — Lactation Note (Signed)
This note was copied from a baby's chart. Lactation Consultation Note  Follow up visit made prior to discharge with newborn son.  Baby girl remains in NICU.  Mom states baby boy is latching but inconsistent with how well his does.  Reassured this is very normal for a late preterm baby and feedings should improve as babies reach term.  Discussed priorities are to establish a good milk supply by pumping 8 times per day and feeding baby amounts as directed on late preterm handout.  Encouraged to continue putting babies to breast when possible.  Mom relieved to know information shared.  She has a Spectra pump at home.  Discussed returning with babies after discharge for an outpatient feeding assessment with pre and post weights and plan.  Will continue to follow mom and daughter in NICU.  Patient Name: Angela George ZOXWR'UToday's Date: 09/16/2015     Maternal Data    Feeding Feeding Type: Bottle Fed - Breast Milk Nipple Type: Slow - flow Length of feed: 7 min  LATCH Score/Interventions                      Lactation Tools Discussed/Used     Consult Status      [name], [name] 09/16/2015, 4:19 PM

## 2018-05-27 ENCOUNTER — Encounter (HOSPITAL_COMMUNITY): Payer: Self-pay | Admitting: Emergency Medicine

## 2018-05-27 ENCOUNTER — Emergency Department (HOSPITAL_COMMUNITY)
Admission: EM | Admit: 2018-05-27 | Discharge: 2018-05-27 | Disposition: A | Discharge status: Home / Self Care | Payer: 59 | Attending: Emergency Medicine | Admitting: Emergency Medicine

## 2018-05-27 ENCOUNTER — Other Ambulatory Visit: Payer: Self-pay

## 2018-05-27 DIAGNOSIS — N750 Cyst of Bartholin's gland: Secondary | ICD-10-CM | POA: Diagnosis present

## 2018-05-27 DIAGNOSIS — Z87891 Personal history of nicotine dependence: Secondary | ICD-10-CM | POA: Insufficient documentation

## 2018-05-27 DIAGNOSIS — Z79899 Other long term (current) drug therapy: Secondary | ICD-10-CM | POA: Insufficient documentation

## 2018-05-27 DIAGNOSIS — Z23 Encounter for immunization: Secondary | ICD-10-CM | POA: Insufficient documentation

## 2018-05-27 HISTORY — DX: Cyst of Bartholin's gland: N75.0

## 2018-05-27 MED ORDER — OXYCODONE-ACETAMINOPHEN 5-325 MG PO TABS: 1.0000 | ORAL_TABLET | Freq: Four times a day (QID) | ORAL | 0 refills | Status: DC | PRN

## 2018-05-27 MED ORDER — LIDOCAINE-EPINEPHRINE (PF) 2 %-1:200000 IJ SOLN
10.0000 mL | Freq: Once | INTRAMUSCULAR | Status: AC
  Administered 2018-05-27: 10 mL via INTRADERMAL
  Filled 2018-05-27: qty 10

## 2018-05-27 MED ORDER — TETANUS-DIPHTH-ACELL PERTUSSIS 5-2.5-18.5 LF-MCG/0.5 IM SUSP
0.5000 mL | Freq: Once | INTRAMUSCULAR | Status: AC
  Administered 2018-05-27: 0.5 mL via INTRAMUSCULAR
  Filled 2018-05-27: qty 0.5

## 2018-05-27 NOTE — ED Triage Notes (Signed)
C/o Bartholin Cyst since yesterday.

## 2018-05-27 NOTE — ED Provider Notes (Signed)
[facility] EMERGENCY DEPARTMENT Provider Note   CSN: [id number] Arrival date & time: 05/27/18  1631     History   Chief Complaint Chief Complaint  Patient presents with  . Cyst    HPI Angela George is a 37 y.o. female.  The history is provided by the patient. No language interpreter was used.     37 year old female with hx of PCOS, recurrent Bartholin Cyst presenting c/o Bartholin cyst infection.  Patient mention she has recurrent Bartholin cyst which required incision and drainage as well as marsupialization in the past.  For the past 2 days she noticed significant swelling to her left fourth and cyst that caused moderate amount of pain.  Pain is sharp throbbing achy moderate in severity.  She tries warm compress without relief.  No report of fever, chills, dysuria.  She does have a specialist that normally manage it however she could not schedule an appointment quick enough and decided to come to ER.  She is not up-to-date with tetanus.  She denies any recent injury.   Past Medical History:  Diagnosis Date  . Ankle fracture, right   . Anxiety   . Bartholin cyst   . Leg fracture, left   . PCOS (polycystic ovarian syndrome)   . Vaginal Pap smear, abnormal     Patient Active Problem List   Diagnosis Date Noted  . Leakage of amniotic fluid 09/13/2015  . Advanced maternal age in multigravida 04/13/2015    Past Surgical History:  Procedure Laterality Date  . BARTHOLIN CYST MARSUPIALIZATION Bilateral 2015  . WISDOM TOOTH EXTRACTION       OB History    Gravida  2   Para  1   Term      Preterm  1   AB  1   Living  2     SAB      TAB  1   Ectopic      Multiple  1   Live Births  2            Home Medications    Prior to Admission medications   Medication Sig Start Date End Date Taking? Authorizing Provider  oxyCODONE-acetaminophen (PERCOCET/ROXICET) 5-325 MG tablet Take 1 tablet by mouth every 4 (four) hours as needed (pain  scale 4-7). 09/15/15   Levi Aland, MD  Prenatal Vit-Fe Fumarate-FA (PRENATAL MULTIVITAMIN) TABS tablet Take 1 tablet by mouth daily at 12 noon.    [provider]  sertraline (ZOLOFT) 25 MG tablet Take 25 mg by mouth daily.    [provider]    Family History Family History  Problem Relation Age of Onset  . Asthma Neg Hx   . Cancer Neg Hx   . Diabetes Neg Hx   . Hearing loss Neg Hx   . Heart disease Neg Hx   . Hypertension Neg Hx   . Stroke Neg Hx     Social History Social History   Tobacco Use  . Smoking status: Former Smoker    Packs/day: 0.10    Years: 10.00    Pack years: 1.00    Types: Cigarettes  . Smokeless tobacco: Never Used  . Tobacco comment: quit with preg  Substance Use Topics  . Alcohol use: Yes    Comment: none with preg  . Drug use: Yes    Frequency: 1.0 times per week    Types: Cocaine, Marijuana    Comment: quit with preg  Allergies   Patient has no known allergies.   Review of Systems Review of Systems  Constitutional: Negative for fever.  Genitourinary: Positive for vaginal pain. Negative for dysuria and vaginal bleeding.  Skin: Negative for wound.  Neurological: Negative for numbness.     Physical Exam Updated Vital Signs BP (!) 101/56 (BP Location: Right Arm)   Pulse 62   Temp 97.7 F (36.5 C) (Oral)   Resp 18   SpO2 99%   Physical Exam  Constitutional: She appears well-developed and well-nourished. No distress.  HENT:  Head: Atraumatic.  Eyes: Conjunctivae are normal.  Neck: Neck supple.  Genitourinary:  Genitourinary Comments: Chaperone present during exam.  An area of induration and fluctuant noted to left inferior labia consistent with an infected Bartholin cyst.  It is tender to palpation.  Neurological: She is alert.  Skin: No rash noted.  Psychiatric: She has a normal mood and affect.  Nursing note and vitals reviewed.    ED Treatments / Results  Labs (all labs ordered are listed, but  only abnormal results are displayed) Labs Reviewed - No data to display  EKG None  Radiology No results found.  Procedures .Marland Kitchen.Incision and Drainage Date/Time: 05/27/2018 7:29 PM Performed by: Fayrene Helperran, Sencere Symonette, PA-C Authorized by: Fayrene Helperran, Avonell Lenig, PA-C   Consent:    Consent obtained:  Verbal   Consent given by:  Patient   Risks discussed:  Bleeding, incomplete drainage, pain and damage to other organs   Alternatives discussed:  No treatment Universal protocol:    Procedure explained and questions answered to patient or proxy's satisfaction: yes     Relevant documents present and verified: yes     Test results available and properly labeled: yes     Imaging studies available: yes     Required blood products, implants, devices, and special equipment available: yes     Site/side marked: yes     Immediately prior to procedure a time out was called: yes     Patient identity confirmed:  Verbally with patient Location:    Type:  Bartholin cyst   Size:  3cm   Location:  Anogenital   Anogenital location:  Bartholin's gland Pre-procedure details:    Skin preparation:  Betadine Anesthesia (see MAR for exact dosages):    Anesthesia method:  Local infiltration   Local anesthetic:  Lidocaine 1% WITH epi Procedure type:    Complexity:  Complex Procedure details:    Incision types:  Single straight   Incision depth:  Subcutaneous   Scalpel blade:  11   Wound management:  Probed and deloculated, irrigated with saline and extensive cleaning   Drainage:  Purulent   Drainage amount:  Moderate   Packing materials:  Word catheter Post-procedure details:    Patient tolerance of procedure:  Tolerated well, no immediate complications   (including critical care time)  Medications Ordered in ED Medications  Tdap (BOOSTRIX) injection 0.5 mL (0.5 mLs Intramuscular Given 05/27/18 1853)  lidocaine-EPINEPHrine (XYLOCAINE W/EPI) 2 %-1:200000 (PF) injection 10 mL (10 mLs Intradermal Given by Other  05/27/18 1853)     Initial Impression / Assessment and Plan / ED Course  I have reviewed the triage vital signs and the nursing notes.  Pertinent labs & imaging results that were available during my care of the patient were reviewed by me and considered in my medical decision making (see chart for details).     BP (!) 101/56 (BP Location: Right Arm)   Pulse 62   Temp 97.7 F (  36.5 C) (Oral)   Resp 18   SpO2 99%    Final Clinical Impressions(s) / ED Diagnoses   Final diagnoses:  Infected cyst of Bartholin's gland duct    ED Discharge Orders         Ordered    oxyCODONE-acetaminophen (PERCOCET/ROXICET) 5-325 MG tablet  Every 6 hours PRN     05/27/18 1932         6:39 PM Patient has an infected left Bartholin cyst which will benefit from incision and drainage and placement of Word catheter.  7:29 PM Successful I&D of L bartholin's cyst, and placement of word catheter.  Pt tolerates well.  Will d/c home with pain medication and outpt f/u. Return precaution given.  In order to decrease risk of narcotic abuse. Pt's record were checked using the [place] Controlled Substance database.     Fayrene Helper, PA-C 05/27/18 1933    Raeford Razor, MD 05/28/18 1258

## 2018-05-30 ENCOUNTER — Ambulatory Visit
Admission: RE | Admit: 2018-05-30 | Discharge: 2018-05-30 | Disposition: A | Discharge status: Home / Self Care | Payer: 59 | Source: Ambulatory Visit | Attending: Obstetrics & Gynecology | Admitting: Obstetrics & Gynecology

## 2018-05-30 ENCOUNTER — Other Ambulatory Visit: Payer: Self-pay

## 2018-05-30 ENCOUNTER — Encounter: Payer: Self-pay | Admitting: *Deleted

## 2018-05-30 ENCOUNTER — Ambulatory Visit: Payer: 59 | Admitting: Anesthesiology

## 2018-05-30 ENCOUNTER — Encounter
Admission: RE | Disposition: A | Discharge status: Home / Self Care | Payer: Self-pay | Source: Ambulatory Visit | Attending: Obstetrics & Gynecology

## 2018-05-30 DIAGNOSIS — Z79899 Other long term (current) drug therapy: Secondary | ICD-10-CM | POA: Diagnosis not present

## 2018-05-30 DIAGNOSIS — F419 Anxiety disorder, unspecified: Secondary | ICD-10-CM | POA: Insufficient documentation

## 2018-05-30 DIAGNOSIS — N750 Cyst of Bartholin's gland: Secondary | ICD-10-CM | POA: Diagnosis present

## 2018-05-30 DIAGNOSIS — Z87891 Personal history of nicotine dependence: Secondary | ICD-10-CM | POA: Insufficient documentation

## 2018-05-30 HISTORY — PX: BARTHOLIN GLAND CYST EXCISION: SHX565

## 2018-05-30 LAB — CBC
HCT: 33.7 % — ABNORMAL LOW (ref 36.0–46.0)
Hemoglobin: 11.5 g/dL — ABNORMAL LOW (ref 12.0–15.0)
MCH: 31.3 pg (ref 26.0–34.0)
MCHC: 34.1 g/dL (ref 30.0–36.0)
MCV: 91.8 fL (ref 80.0–100.0)
NRBC: 0 % (ref 0.0–0.2)
PLATELETS: 217 10*3/uL (ref 150–400)
RBC: 3.67 MIL/uL — ABNORMAL LOW (ref 3.87–5.11)
RDW: 12.9 % (ref 11.5–15.5)
WBC: 5.2 10*3/uL (ref 4.0–10.5)

## 2018-05-30 LAB — BASIC METABOLIC PANEL
ANION GAP: 9 (ref 5–15)
BUN: 13 mg/dL (ref 6–20)
CALCIUM: 8.4 mg/dL — AB (ref 8.9–10.3)
CO2: 23 mmol/L (ref 22–32)
Chloride: 109 mmol/L (ref 98–111)
Creatinine, Ser: 0.66 mg/dL (ref 0.44–1.00)
Glucose, Bld: 102 mg/dL — ABNORMAL HIGH (ref 70–99)
Potassium: 3.9 mmol/L (ref 3.5–5.1)
SODIUM: 141 mmol/L (ref 135–145)

## 2018-05-30 LAB — URINE DRUG SCREEN, QUALITATIVE (ARMC ONLY)
Amphetamines, Ur Screen: NOT DETECTED
BARBITURATES, UR SCREEN: NOT DETECTED
BENZODIAZEPINE, UR SCRN: POSITIVE — AB
Cannabinoid 50 Ng, Ur ~~LOC~~: NOT DETECTED
Cocaine Metabolite,Ur ~~LOC~~: NOT DETECTED
MDMA (Ecstasy)Ur Screen: NOT DETECTED
METHADONE SCREEN, URINE: NOT DETECTED
OPIATE, UR SCREEN: NOT DETECTED
Phencyclidine (PCP) Ur S: NOT DETECTED
TRICYCLIC, UR SCREEN: NOT DETECTED

## 2018-05-30 LAB — TYPE AND SCREEN
ABO/RH(D): B NEG
ANTIBODY SCREEN: NEGATIVE

## 2018-05-30 LAB — ABO/RH: ABO/RH(D): B NEG

## 2018-05-30 LAB — POCT PREGNANCY, URINE: Preg Test, Ur: NEGATIVE

## 2018-05-30 SURGERY — EXCISION, BARTHOLIN'S GLAND
Anesthesia: General | Laterality: Bilateral

## 2018-05-30 MED ORDER — CEFAZOLIN SODIUM-DEXTROSE 2-4 GM/100ML-% IV SOLN
2.0000 g | INTRAVENOUS | Status: AC
  Administered 2018-05-30: 2 g via INTRAVENOUS

## 2018-05-30 MED ORDER — BUPIVACAINE LIPOSOME 1.3 % IJ SUSP
INTRAMUSCULAR | Status: AC
  Filled 2018-05-30: qty 20

## 2018-05-30 MED ORDER — FENTANYL CITRATE (PF) 100 MCG/2ML IJ SOLN
INTRAMUSCULAR | Status: DC | PRN
  Administered 2018-05-30 (×5): 25 ug via INTRAVENOUS
  Administered 2018-05-30 (×2): 50 ug via INTRAVENOUS
  Administered 2018-05-30: 25 ug via INTRAVENOUS

## 2018-05-30 MED ORDER — FENTANYL CITRATE (PF) 100 MCG/2ML IJ SOLN
INTRAMUSCULAR | Status: AC
  Filled 2018-05-30: qty 2

## 2018-05-30 MED ORDER — LIDOCAINE HCL (PF) 2 % IJ SOLN
INTRAMUSCULAR | Status: AC
  Filled 2018-05-30: qty 10

## 2018-05-30 MED ORDER — HYDROCORTISONE 2.5 % RE CREA
TOPICAL_CREAM | RECTAL | Status: DC
  Administered 2018-05-30: 1 via RECTAL
  Filled 2018-05-30: qty 28.35

## 2018-05-30 MED ORDER — SODIUM CHLORIDE FLUSH 0.9 % IV SOLN
INTRAVENOUS | Status: AC
  Filled 2018-05-30: qty 10

## 2018-05-30 MED ORDER — PHENYLEPHRINE HCL 10 MG/ML IJ SOLN
INTRAMUSCULAR | Status: DC | PRN
  Administered 2018-05-30: 100 ug via INTRAVENOUS

## 2018-05-30 MED ORDER — LIDOCAINE-EPINEPHRINE 1 %-1:100000 IJ SOLN
INTRAMUSCULAR | Status: AC
  Filled 2018-05-30: qty 1

## 2018-05-30 MED ORDER — LACTATED RINGERS IV SOLN
Freq: Once | INTRAVENOUS | Status: AC
  Administered 2018-05-30: 08:00:00 via INTRAVENOUS

## 2018-05-30 MED ORDER — MIDAZOLAM HCL 2 MG/2ML IJ SOLN
INTRAMUSCULAR | Status: AC
  Filled 2018-05-30: qty 2

## 2018-05-30 MED ORDER — SODIUM CHLORIDE 0.9 % IV SOLN
INTRAVENOUS | Status: DC | PRN
  Administered 2018-05-30: 40 mL

## 2018-05-30 MED ORDER — LACTATED RINGERS IV SOLN: INTRAVENOUS | Status: DC

## 2018-05-30 MED ORDER — CEFAZOLIN SODIUM-DEXTROSE 2-4 GM/100ML-% IV SOLN
INTRAVENOUS | Status: AC
  Filled 2018-05-30: qty 100

## 2018-05-30 MED ORDER — OXYCODONE-ACETAMINOPHEN 5-325 MG PO TABS: 1.0000 | ORAL_TABLET | Freq: Four times a day (QID) | ORAL | 0 refills | Status: AC | PRN

## 2018-05-30 MED ORDER — MORPHINE SULFATE (PF) 4 MG/ML IV SOLN: 1.0000 mg | INTRAVENOUS | Status: DC | PRN

## 2018-05-30 MED ORDER — SODIUM CHLORIDE (PF) 0.9 % IJ SOLN
INTRAMUSCULAR | Status: AC
  Filled 2018-05-30: qty 50

## 2018-05-30 MED ORDER — PROPOFOL 10 MG/ML IV BOLUS
INTRAVENOUS | Status: DC | PRN
  Administered 2018-05-30: 200 mg via INTRAVENOUS

## 2018-05-30 MED ORDER — EPHEDRINE SULFATE 50 MG/ML IJ SOLN
INTRAMUSCULAR | Status: DC | PRN
  Administered 2018-05-30: 10 mg via INTRAVENOUS

## 2018-05-30 MED ORDER — MIDAZOLAM HCL 2 MG/2ML IJ SOLN
INTRAMUSCULAR | Status: DC | PRN
  Administered 2018-05-30: 2 mg via INTRAVENOUS

## 2018-05-30 MED ORDER — ACETAMINOPHEN 325 MG PO TABS: 650.0000 mg | ORAL_TABLET | ORAL | Status: DC | PRN

## 2018-05-30 MED ORDER — PROMETHAZINE HCL 25 MG/ML IJ SOLN
6.2500 mg | INTRAMUSCULAR | Status: DC | PRN
  Administered 2018-05-30: 6.25 mg via INTRAVENOUS

## 2018-05-30 MED ORDER — SEVOFLURANE IN SOLN
RESPIRATORY_TRACT | Status: AC
  Filled 2018-05-30: qty 250

## 2018-05-30 MED ORDER — ACETAMINOPHEN 650 MG RE SUPP
650.0000 mg | RECTAL | Status: DC | PRN
  Filled 2018-05-30: qty 1

## 2018-05-30 MED ORDER — DEXAMETHASONE SODIUM PHOSPHATE 10 MG/ML IJ SOLN
INTRAMUSCULAR | Status: DC | PRN
  Administered 2018-05-30: 10 mg via INTRAVENOUS

## 2018-05-30 MED ORDER — ONDANSETRON HCL 4 MG/2ML IJ SOLN
INTRAMUSCULAR | Status: AC
  Filled 2018-05-30: qty 2

## 2018-05-30 MED ORDER — LIDOCAINE HCL (CARDIAC) PF 100 MG/5ML IV SOSY
PREFILLED_SYRINGE | INTRAVENOUS | Status: DC | PRN
  Administered 2018-05-30: 100 mg via INTRAVENOUS

## 2018-05-30 MED ORDER — FENTANYL CITRATE (PF) 100 MCG/2ML IJ SOLN
25.0000 ug | INTRAMUSCULAR | Status: DC | PRN
  Administered 2018-05-30 (×2): 25 ug via INTRAVENOUS

## 2018-05-30 MED ORDER — DEXAMETHASONE SODIUM PHOSPHATE 10 MG/ML IJ SOLN
INTRAMUSCULAR | Status: AC
  Filled 2018-05-30: qty 1

## 2018-05-30 MED ORDER — PROMETHAZINE HCL 25 MG/ML IJ SOLN
INTRAMUSCULAR | Status: AC
  Filled 2018-05-30: qty 1

## 2018-05-30 MED ORDER — KETOROLAC TROMETHAMINE 15 MG/ML IJ SOLN
15.0000 mg | Freq: Four times a day (QID) | INTRAMUSCULAR | Status: DC
  Administered 2018-05-30: 15 mg via INTRAVENOUS
  Filled 2018-05-30: qty 1

## 2018-05-30 MED ORDER — ONDANSETRON HCL 4 MG/2ML IJ SOLN
INTRAMUSCULAR | Status: DC | PRN
  Administered 2018-05-30: 4 mg via INTRAVENOUS

## 2018-05-30 MED ORDER — IBUPROFEN 800 MG PO TABS: 800.0000 mg | ORAL_TABLET | Freq: Four times a day (QID) | ORAL | 1 refills | Status: AC

## 2018-05-30 MED ORDER — PROPOFOL 10 MG/ML IV BOLUS
INTRAVENOUS | Status: AC
  Filled 2018-05-30: qty 20

## 2018-05-30 SURGICAL SUPPLY — 30 items
BLADE SURG 15 STRL LF DISP TIS (BLADE) ×1 IMPLANT
BLADE SURG 15 STRL SS (BLADE) ×3
BLADE SURG SZ10 CARB STEEL (BLADE) ×1 IMPLANT
CATH ROBINSON RED A/P 16FR (CATHETERS) ×3 IMPLANT
COVER WAND RF STERILE (DRAPES) ×3 IMPLANT
DRAPE PERI LITHO V/GYN (MISCELLANEOUS) ×3 IMPLANT
DRAPE UNDER BUTTOCK W/FLU (DRAPES) ×3 IMPLANT
ELECT REM PT RETURN 9FT ADLT (ELECTROSURGICAL) ×3
ELECTRODE REM PT RTRN 9FT ADLT (ELECTROSURGICAL) ×1 IMPLANT
GAUZE 4X4 16PLY RFD (DISPOSABLE) ×3 IMPLANT
GLOVE PI ORTHOPRO 6.5 (GLOVE) ×12
GLOVE PI ORTHOPRO STRL 6.5 (GLOVE) ×1 IMPLANT
GLOVE SURG SYN 6.5 ES PF (GLOVE) ×3 IMPLANT
GLOVE SURG SYN 6.5 PF PI (GLOVE) ×1 IMPLANT
GOWN STRL REUS W/ TWL LRG LVL3 (GOWN DISPOSABLE) ×2 IMPLANT
GOWN STRL REUS W/TWL LRG LVL3 (GOWN DISPOSABLE) ×6
KIT TURNOVER CYSTO (KITS) ×3 IMPLANT
NEEDLE HYPO 22GX1.5 SAFETY (NEEDLE) ×3 IMPLANT
NS IRRIG 500ML POUR BTL (IV SOLUTION) ×3 IMPLANT
PACK BASIN MINOR ARMC (MISCELLANEOUS) ×3 IMPLANT
PAD OB MATERNITY 4.3X12.25 (PERSONAL CARE ITEMS) ×3 IMPLANT
PAD PREP 24X41 OB/GYN DISP (PERSONAL CARE ITEMS) ×3 IMPLANT
SPONGE LAP 4X18 RFD (DISPOSABLE) ×8 IMPLANT
SUT VIC AB 2-0 CT1 27 (SUTURE) ×12
SUT VIC AB 2-0 CT1 TAPERPNT 27 (SUTURE) ×2 IMPLANT
SUT VIC AB 3-0 SH 27 (SUTURE) ×3
SUT VIC AB 3-0 SH 27X BRD (SUTURE) IMPLANT
SUT VIC AB 4-0 SH 27 (SUTURE) ×9
SUT VIC AB 4-0 SH 27XANBCTRL (SUTURE) IMPLANT
SYR 10ML LL (SYRINGE) ×3 IMPLANT

## 2018-05-30 NOTE — Anesthesia Procedure Notes (Signed)
Procedure Name: LMA Insertion Date/Time: 05/30/2018 8:07 AM Performed by: Stormy Fabianurtis, Amairany Schumpert, CRNA Pre-anesthesia Checklist: Patient identified, Patient being monitored, Timeout performed, Emergency Drugs available and Suction available Patient Re-evaluated:Patient Re-evaluated prior to induction Oxygen Delivery Method: Circle system utilized Preoxygenation: Pre-oxygenation with 100% oxygen Induction Type: IV induction Ventilation: Mask ventilation without difficulty LMA: LMA inserted LMA Size: 3.5 Tube type: Oral Number of attempts: 1 Placement Confirmation: positive ETCO2 and breath sounds checked- equal and bilateral Tube secured with: Tape Dental Injury: Teeth and Oropharynx as per pre-operative assessment

## 2018-05-30 NOTE — Anesthesia Postprocedure Evaluation (Signed)
Anesthesia Post Note  Patient: Angela George  Procedure(s) Performed: BARTHOLIN GLAND EXCISION (Bilateral )  Patient location during evaluation: PACU Anesthesia Type: General Level of consciousness: awake and alert Pain management: pain level controlled Vital Signs Assessment: post-procedure vital signs reviewed and stable Respiratory status: spontaneous breathing, nonlabored ventilation, respiratory function stable and patient connected to nasal cannula oxygen Cardiovascular status: blood pressure returned to baseline and stable Postop Assessment: no apparent nausea or vomiting Anesthetic complications: no     Last Vitals:  Vitals:   05/30/18 1209 05/30/18 1324  BP: 114/67 111/68  Pulse: 85 81  Resp: 18 16  Temp: 36.7 C   SpO2: 100% 100%    Last Pain:  Vitals:   05/30/18 1324  TempSrc:   PainSc: 0-No pain                 Lenard SimmerAndrew Siyah Mault

## 2018-05-30 NOTE — Transfer of Care (Signed)
Immediate Anesthesia Transfer of Care Note  Patient: Angela George  Procedure(s) Performed: BARTHOLIN GLAND EXCISION (Bilateral )  Patient Location: PACU  Anesthesia Type:General  Level of Consciousness: sedated  Airway & Oxygen Therapy: Patient Spontanous Breathing and Patient connected to face mask oxygen  Post-op Assessment: Report given to RN and Post -op Vital signs reviewed and stable  Post vital signs: Reviewed and stable  Last Vitals:  Vitals Value Taken Time  BP 106/74 05/30/2018 10:39 AM  Temp    Pulse 76 05/30/2018 10:42 AM  Resp 13 05/30/2018 10:42 AM  SpO2 100 % 05/30/2018 10:42 AM  Vitals shown include unvalidated device data.  Last Pain:  Vitals:   05/30/18 1039  TempSrc: Temporal  PainSc:       Patients Stated Pain Goal: 1 (05/30/18 [id number])  Complications: No apparent anesthesia complications

## 2018-05-30 NOTE — Op Note (Signed)
[facility] PROCEDURE DATE: 05/30/2018  PATIENT:  Angela George  37 y.o. female  PRE-OPERATIVE DIAGNOSIS:  Recurrent bartholin gland cyst bilateral  POST-OPERATIVE DIAGNOSIS: Recurrent bartholin gland cyst bilateral  PROCEDURE:  Procedure(s): BARTHOLIN GLAND EXCISION (Bilateral)  SURGEON:  Surgeon(s) and Role:    * Achillies Buehl, Elenora Fenderhelsea C, MD - Primary  ANESTHESIA:  General via ET  I/O  Total I/O In: 1600 [I.V.:1600] Out: 900 [Urine:0; Blood:900]  FINDINGS:  RIGHT small cystic structures at 7:00 of the introitus, LEFT large deflated cystic structure at 5:00 with Word catheter in place on external vulva.   SPECIMEN: bilateral bartholin glands and surrounding tissues.  COMPLICATIONS: none apparent  DISPOSITION: vital signs stable to PACU   Indication for Surgery: 37 y.o. [id number] who has had recurrent Bartholin gland cysts and abscesses over the last 3 years, and is s/p bilateral marsupialization. She started developing recurrent cysts/abscesses again recently, and they have been frequent.  She was seen in the emergency room 2 days ago for another Word catheter placement, and continues to be in considerable pain.    Risks of surgery were discussed with the patient including but not limited to: bleeding which may require transfusion or reoperation; infection which may require antibiotics; injury to bowel, bladder, ureters or other surrounding organs; need for additional procedures including laparotomy, blood clot, incisional problems and other postoperative/anesthesia complications. Written informed consent was obtained.      PROCEDURE IN DETAIL:  The patient had sequential compression devices applied to her lower extremities while in the preoperative area.  She was then taken to the operating room where general anesthesia was administered and was found to be adequate.  She was placed in the dorsal lithotomy position, and was prepped and draped in a sterile manner.  A surgical timeout  was performed.   The right cystic structures were identified, 20cc of diluted Exparel was injected at the site, and a scalpel was used to incise the vaginal wall down to the cyst.  Hemostat and metzenbaums were used to isolate the cystic structure, until it was ruptured.  At that time, it was grasped with a hemostat, and the surrounding tissues were dissected until the originating tract was isolated and transected.  2-0 and 3-0 vicryl was used to reapproximate the tissues from deep to superficial in many layers, ensuring hemostasis throughout each layer closure.  Upon reaching the vaginal epithelium, sub-dermal closure technique was used to reapproximate the skin.  There was some welling of blood, so the vagina was packed with sponges.    The attention was turned to the left, and the Word catheter removed.  A hemostat was used to identify the planes of the deflated gland cyst, and an identical procedure was performed on the left as was the right.  Bovie cauterization was used moreso on this side rather than metzenbaums to help with hemostasis, however both were used.  Again both 2-0 and 3-0 vicryl was used to close the space once the gland was removed.    The operative site was surveyed, and it was found to be hemostatic.  No intraoperative injury to surrounding organs was noted.  The vaginal packing was removed  Vital signs remained stable throughout the case.   All instruments, needles, and sponge counts were correct x 2. The patient was taken to the recovery room in stable condition.   A total of 20cc of Exparel (in 20cc NS) was injected into the bilateral sites.    ---- Ranae Plumberhelsea Yisel Megill, MD Attending Obstetrician  and Event organiser Clinic OB/GYN [facility]

## 2018-05-30 NOTE — H&P (Signed)
Preoperative History and Physical  Angela George is a 37 y.o. [phone] here for surgical management of bilateral recurrent bartholin gland cysts.  No significant preoperative concerns.  She is s/p bilateral marsupialization and continues to have recurrence, requiring several I&Ds in the last two months    Proposed surgery: bilateral bartholin gland excision    Past Medical History:  Diagnosis Date  . Ankle fracture, right   . Anxiety   . Bartholin cyst   . Leg fracture, left   . PCOS (polycystic ovarian syndrome)   . Vaginal Pap smear, abnormal    Past Surgical History:  Procedure Laterality Date  . BARTHOLIN CYST MARSUPIALIZATION Bilateral 2015  . WISDOM TOOTH EXTRACTION     OB History  Gravida Para Term Preterm AB Living  2 1   1 1 2   SAB TAB Ectopic Multiple Live Births    1   1 2     # Outcome Date GA Lbr Len/2nd Weight Sex Delivery Anes PTL Lv  2A Preterm 09/13/15 [redacted]w[redacted]d 15:22 / 00:40 2455 g M Vag-Spont EPI  LIV  2B Preterm 09/13/15 [redacted]w[redacted]d 15:22 2225 g F Vag-Spont EPI  LIV  1 TAB           Patient denies any other pertinent gynecologic issues.   No current facility-administered medications on file prior to encounter.    Current Outpatient Medications on File Prior to Encounter  Medication Sig Dispense Refill  . ampicillin (PRINCIPEN) 500 MG capsule Take 500 mg by mouth every 6 (six) hours.    . busPIRone (BUSPAR) 30 MG tablet Take 7.5 mg by mouth 2 (two) times daily.    Marland Kitchen LORazepam (ATIVAN) 1 MG tablet Take 1 mg by mouth 2 (two) times daily.    Marland Kitchen oxyCODONE-acetaminophen (PERCOCET/ROXICET) 5-325 MG tablet Take 1 tablet by mouth every 6 (six) hours as needed for moderate pain or severe pain. 10 tablet 0  . valACYclovir (VALTREX) 500 MG tablet Take 500 mg by mouth daily.     No Known Allergies  Social History:   reports that she has quit smoking. Her smoking use included cigarettes. She has a 1.00 pack-year smoking history. She has never used smokeless tobacco. She  reports that she drinks alcohol. She reports that she has current or past drug history. Drugs: Cocaine and Marijuana. Frequency: 1.00 time per week.  Family History  Problem Relation Age of Onset  . Asthma Neg Hx   . Cancer Neg Hx   . Diabetes Neg Hx   . Hearing loss Neg Hx   . Heart disease Neg Hx   . Hypertension Neg Hx   . Stroke Neg Hx     Review of Systems: Noncontributory  PHYSICAL EXAM: unknown if currently breastfeeding. General appearance - alert, well appearing, and in no distress, but tearful in pain. Chest - clear to auscultation, no wheezes, rales or rhonchi, symmetric air entry Heart - normal rate and regular rhythm Abdomen - soft, nontender, nondistended, no masses or organomegaly Pelvic - examination not indicated.  Word catheter in place  Extremities - peripheral pulses normal, no pedal edema, no clubbing or cyanosis  Labs: No results found for this or any previous visit (from the past 336 hour(s)).  Imaging Studies: No results found.  Assessment: Patient Active Problem List   Diagnosis Date Noted  . Leakage of amniotic fluid 09/13/2015  . Advanced maternal age in multigravida 04/13/2015    Plan: Patient will undergo surgical management with bilateral bartholin gland excision.  The  risks of surgery were discussed in detail with the patient including but not limited to: bleeding which may require transfusion or reoperation; infection which may require antibiotics; injury to surrounding organs.  need for additional procedures; thromboembolic phenomenon, surgical site problems and other postoperative/anesthesia complications. Likelihood of success in alleviating the patient's condition was discussed. Routine postoperative instructions will be reviewed with the patient and her family in detail after surgery.  The patient concurred with the proposed plan, giving informed written consent for the surgery.  Patient has been NPO since last night she will remain NPO for  procedure.  Anesthesia and OR aware.  Preoperative prophylactic antibiotics and SCDs ordered on call to the OR.  To OR when ready.  ----- Ranae Plumberhelsea Zemirah Krasinski, MD Attending Obstetrician and Gynecologist [facility], Department of OB/GYN [facility]

## 2018-05-30 NOTE — Anesthesia Preprocedure Evaluation (Signed)
Anesthesia Evaluation  Patient identified by MRN, date of birth, ID band Patient awake    Reviewed: Allergy & Precautions, H&P , NPO status , Patient's Chart, lab work & pertinent test results, reviewed documented beta blocker date and time   History of Anesthesia Complications Negative for: history of anesthetic complications  Airway Mallampati: I  TM Distance: >3 FB Neck ROM: full    Dental  (+) Dental Advidsory Given, Missing, Caps, Teeth Intact   Pulmonary neg shortness of breath, neg sleep apnea, neg COPD, Recent URI , Residual Cough, former smoker,           Cardiovascular Exercise Tolerance: Good negative cardio ROS       Neuro/Psych PSYCHIATRIC DISORDERS Anxiety negative neurological ROS     GI/Hepatic negative GI ROS, Neg liver ROS,   Endo/Other  negative endocrine ROS  Renal/GU negative Renal ROS  negative genitourinary   Musculoskeletal   Abdominal   Peds  Hematology negative hematology ROS (+)   Anesthesia Other Findings Past Medical History: No date: Ankle fracture, right No date: Anxiety No date: Bartholin cyst No date: Leg fracture, left No date: PCOS (polycystic ovarian syndrome) No date: Vaginal Pap smear, abnormal   Reproductive/Obstetrics negative OB ROS                             Anesthesia Physical Anesthesia Plan  ASA: II  Anesthesia Plan: General   Post-op Pain Management:    Induction: Intravenous  PONV Risk Score and Plan: 3 and Ondansetron, Dexamethasone, Midazolam, Promethazine and Treatment may vary due to age or medical condition  Airway Management Planned: LMA  Additional Equipment:   Intra-op Plan:   Post-operative Plan: Extubation in OR  Informed Consent: I have reviewed the patients History and Physical, chart, labs and discussed the procedure including the risks, benefits and alternatives for the proposed anesthesia with the patient  or authorized representative who has indicated his/her understanding and acceptance.   Dental Advisory Given  Plan Discussed with: Anesthesiologist, CRNA and Surgeon  Anesthesia Plan Comments:         Anesthesia Quick Evaluation

## 2018-05-30 NOTE — Discharge Instructions (Addendum)
Keep ice packs on routinely for 48 hours Ibuprofen 800mg  every 6 hours like clockwork Percocet as needed every 4 hours    Continue the amoxicillin as prescribed  Call office with fever, chills, uncontrolled pain, nausea/vomiting, heavy bleeding     AMBULATORY SURGERY  DISCHARGE INSTRUCTIONS   1) The drugs that you were given will stay in your system until tomorrow so for the next 24 hours you should not:  A) Drive an automobile B) Make any legal decisions C) Drink any alcoholic beverage   2) You may resume regular meals tomorrow.  Today it is better to start with liquids and gradually work up to solid foods.  You may eat anything you prefer, but it is better to start with liquids, then soup and crackers, and gradually work up to solid foods.   3) Please notify your doctor immediately if you have any unusual bleeding, trouble breathing, redness and pain at the surgery site, drainage, fever, or pain not relieved by medication.    4) Additional Instructions:        Please contact your physician with any problems or Same Day Surgery at [phone], Monday through Friday 6 am to 4 pm, or [place] at [facility] Main number at [phone].

## 2018-05-30 NOTE — Anesthesia Post-op Follow-up Note (Signed)
Anesthesia QCDR form completed.        

## 2018-06-04 LAB — SURGICAL PATHOLOGY

## 2018-10-24 ENCOUNTER — Other Ambulatory Visit: Payer: Self-pay | Admitting: Obstetrics & Gynecology

## 2018-10-24 DIAGNOSIS — Z1231 Encounter for screening mammogram for malignant neoplasm of breast: Secondary | ICD-10-CM

## 2019-01-08 ENCOUNTER — Ambulatory Visit
Admission: RE | Admit: 2019-01-08 | Discharge: 2019-01-08 | Disposition: A | Discharge status: Home / Self Care | Payer: Medicaid Other | Source: Ambulatory Visit | Attending: Obstetrics & Gynecology | Admitting: Obstetrics & Gynecology

## 2019-01-08 ENCOUNTER — Other Ambulatory Visit: Payer: Self-pay

## 2019-01-08 DIAGNOSIS — Z1231 Encounter for screening mammogram for malignant neoplasm of breast: Secondary | ICD-10-CM | POA: Diagnosis not present

## 2019-11-21 ENCOUNTER — Other Ambulatory Visit: Payer: Self-pay | Admitting: Obstetrics & Gynecology

## 2019-11-21 DIAGNOSIS — Z1231 Encounter for screening mammogram for malignant neoplasm of breast: Secondary | ICD-10-CM

## 2020-01-14 ENCOUNTER — Ambulatory Visit
Admission: RE | Admit: 2020-01-14 | Discharge: 2020-01-14 | Disposition: A | Discharge status: Home / Self Care | Payer: BC Managed Care – PPO | Source: Ambulatory Visit | Attending: Obstetrics & Gynecology | Admitting: Obstetrics & Gynecology

## 2020-01-14 DIAGNOSIS — Z1231 Encounter for screening mammogram for malignant neoplasm of breast: Secondary | ICD-10-CM | POA: Diagnosis present

## 2021-04-05 ENCOUNTER — Emergency Department (HOSPITAL_BASED_OUTPATIENT_CLINIC_OR_DEPARTMENT_OTHER): Payer: BC Managed Care – PPO

## 2021-04-05 ENCOUNTER — Other Ambulatory Visit: Payer: Self-pay

## 2021-04-05 ENCOUNTER — Emergency Department (HOSPITAL_BASED_OUTPATIENT_CLINIC_OR_DEPARTMENT_OTHER)
Admission: EM | Admit: 2021-04-05 | Discharge: 2021-04-06 | Disposition: A | Discharge status: Home / Self Care | Payer: BC Managed Care – PPO | Attending: Emergency Medicine | Admitting: Emergency Medicine

## 2021-04-05 ENCOUNTER — Encounter (HOSPITAL_BASED_OUTPATIENT_CLINIC_OR_DEPARTMENT_OTHER): Payer: Self-pay

## 2021-04-05 DIAGNOSIS — T07XXXA Unspecified multiple injuries, initial encounter: Secondary | ICD-10-CM | POA: Insufficient documentation

## 2021-04-05 DIAGNOSIS — Z87891 Personal history of nicotine dependence: Secondary | ICD-10-CM | POA: Insufficient documentation

## 2021-04-05 DIAGNOSIS — S060X0A Concussion without loss of consciousness, initial encounter: Secondary | ICD-10-CM | POA: Insufficient documentation

## 2021-04-05 DIAGNOSIS — Y9289 Other specified places as the place of occurrence of the external cause: Secondary | ICD-10-CM | POA: Insufficient documentation

## 2021-04-05 DIAGNOSIS — S0990XA Unspecified injury of head, initial encounter: Secondary | ICD-10-CM | POA: Diagnosis present

## 2021-04-05 DIAGNOSIS — Z23 Encounter for immunization: Secondary | ICD-10-CM | POA: Diagnosis not present

## 2021-04-05 MED ORDER — TETANUS-DIPHTH-ACELL PERTUSSIS 5-2.5-18.5 LF-MCG/0.5 IM SUSY
0.5000 mL | PREFILLED_SYRINGE | Freq: Once | INTRAMUSCULAR | Status: AC
  Administered 2021-04-05: 0.5 mL via INTRAMUSCULAR
  Filled 2021-04-05: qty 0.5
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
## 2021-04-06 MED ORDER — ONDANSETRON 4 MG PO TBDP: 4.0000 mg | ORAL_TABLET | Freq: Three times a day (TID) | ORAL | 0 refills | Status: AC | PRN
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
## 2022-02-02 IMAGING — CT CT HEAD W/O CM
4 series · 16 of 47 positions shown, 18 images · non-contrast
Comparison: None.

CLINICAL DATA: Polytrauma, critical, head/C-spine injury suspected

EXAM:
CT HEAD WITHOUT CONTRAST
CT CERVICAL SPINE WITHOUT CONTRAST
TECHNIQUE: Multidetector CT imaging of the head and cervical spine was
performed following the standard protocol without intravenous
contrast. Multiplanar CT image reconstructions of the cervical spine
were also generated.

[Series 2: head wo · axial · 0.42mm/px · z∈[+81,+191]mm · 7 of 30 slices shown, 9 images]
[im 4/30  brain]
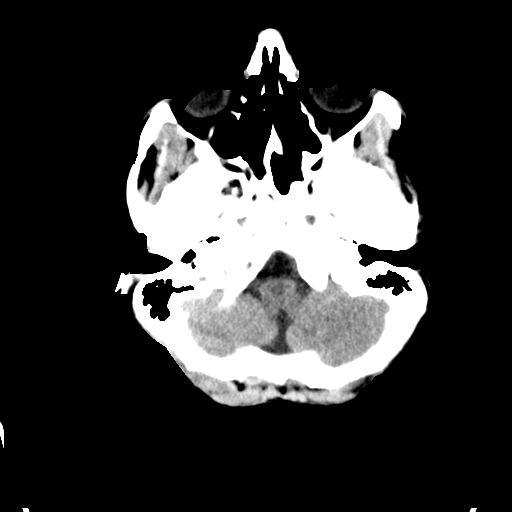
[im 4/30  bone]
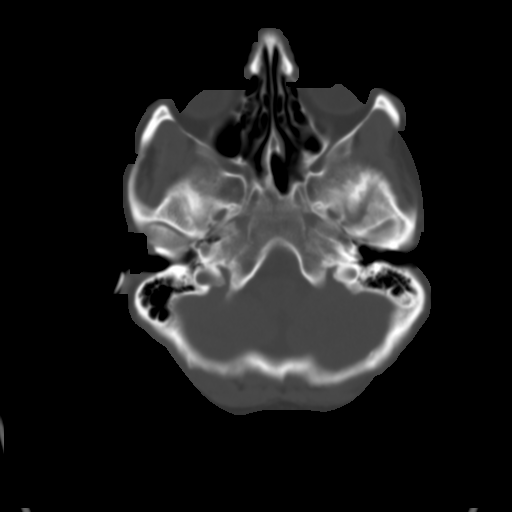
[im 8/30  brain]
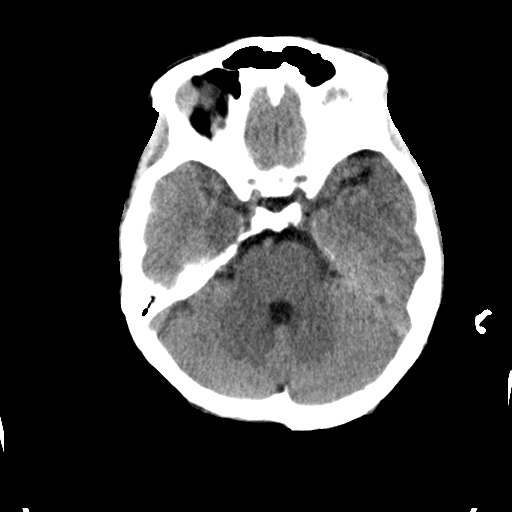
[im 11/30  brain]
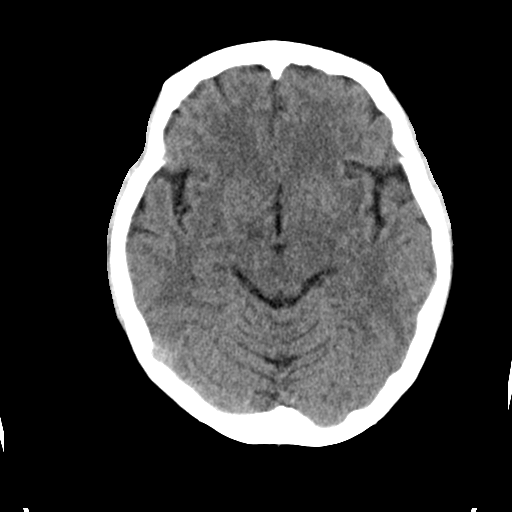
[im 15/30  brain]
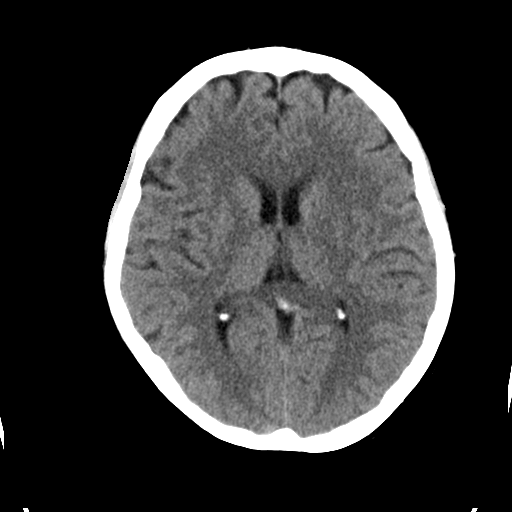
[im 19/30  brain]
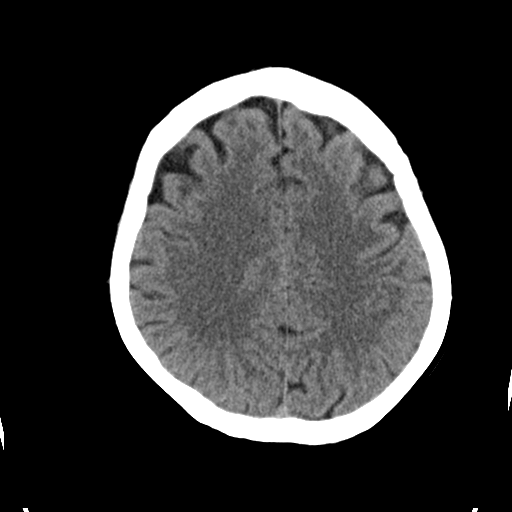
[im 19/30  bone]
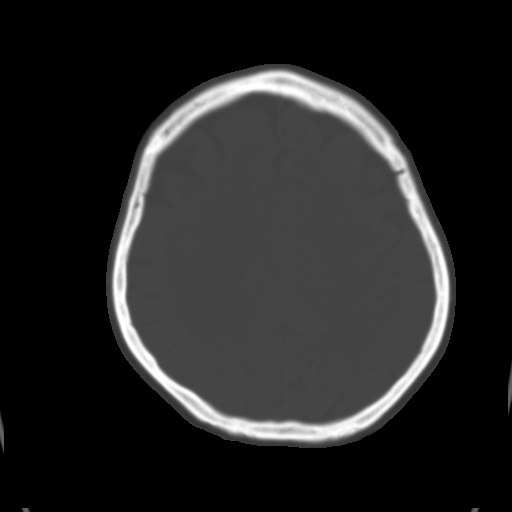
[im 22/30  brain]
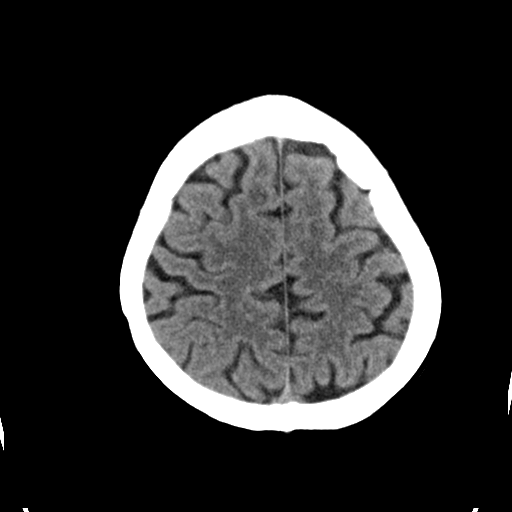
[im 26/30  brain]
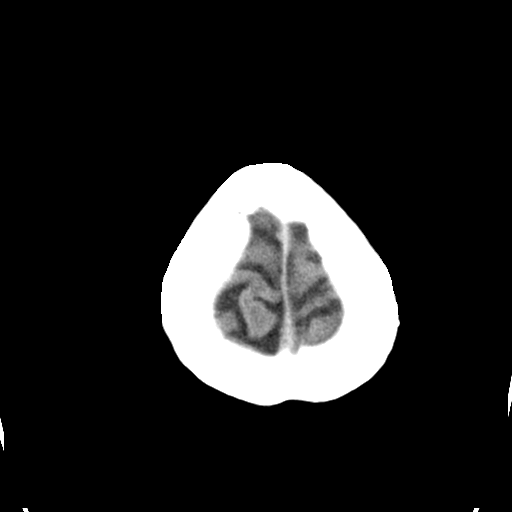

[Series 3: head bone · axial · 0.42mm/px · z∈[+80,+108]mm · 3 of 74 slices shown]
[im 8/74  bone]
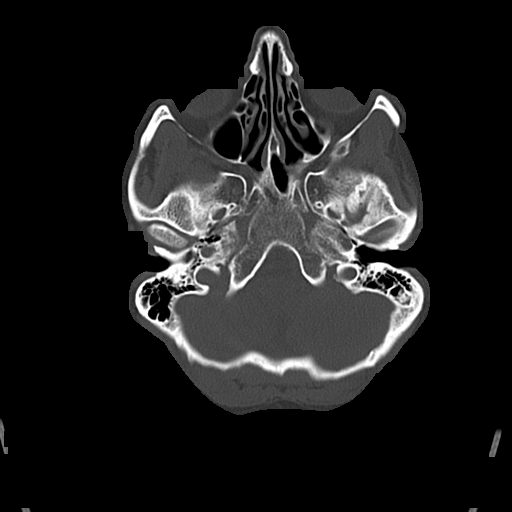
[im 15/74  bone]
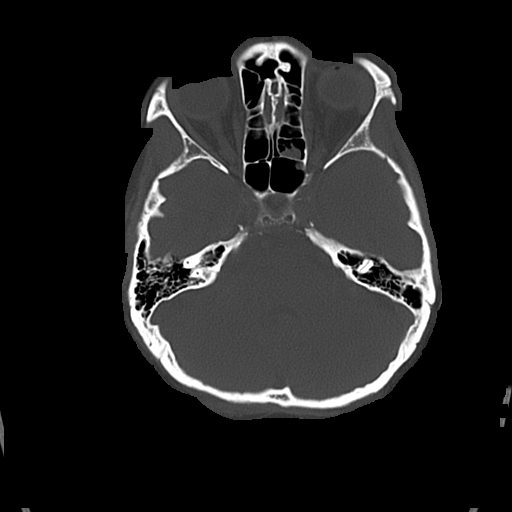
[im 22/74  bone]
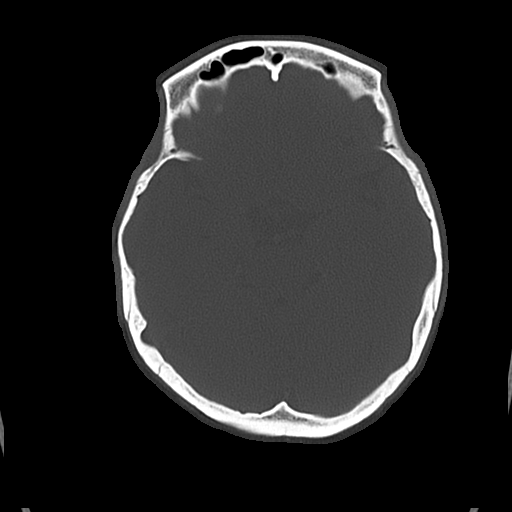

[Series 4: coronal soft · coronal · 0.33mm/px · 3 of 63 slices shown]
[im 21/63  brain]
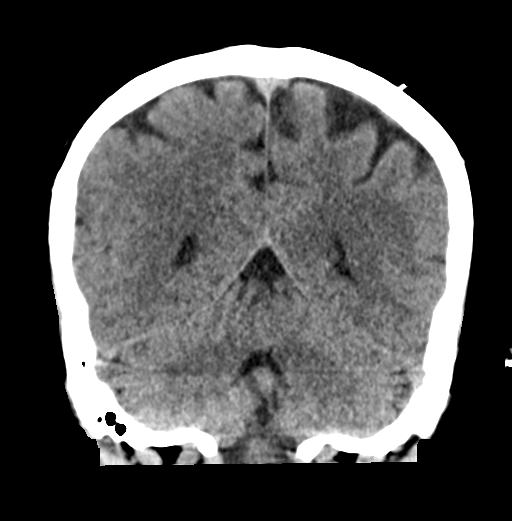
[im 28/63  brain]
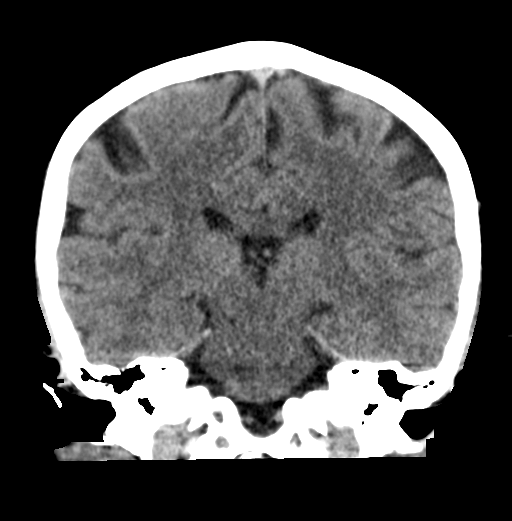
[im 35/63  brain]
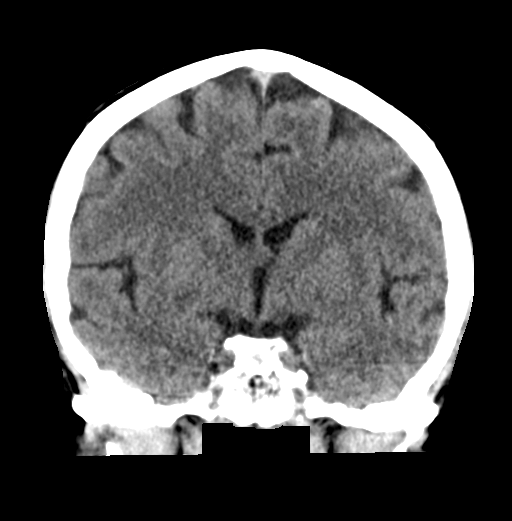

[Series 5: sagittal soft · sagittal · 0.32mm/px · 3 of 58 slices shown]
[im 20/58  brain]
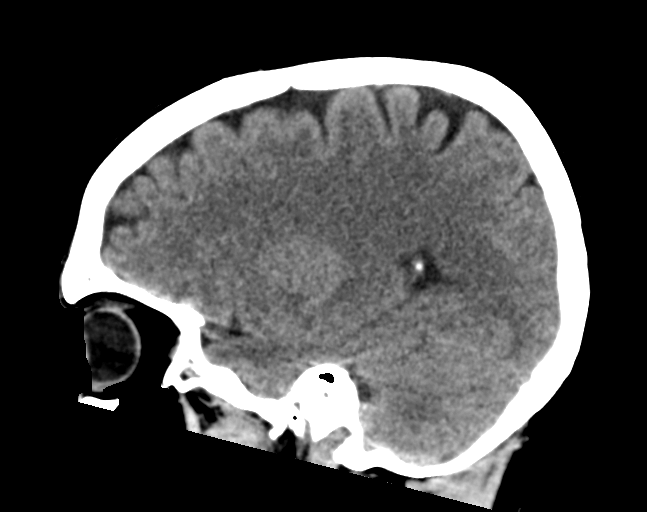
[im 29/58  brain]
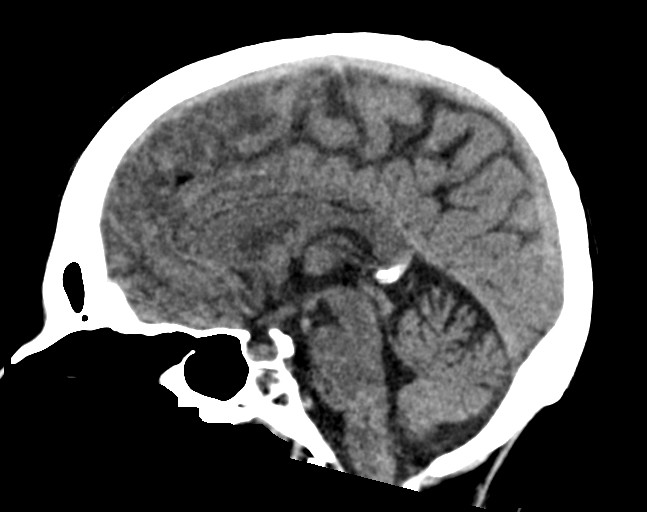
[im 39/58  brain]
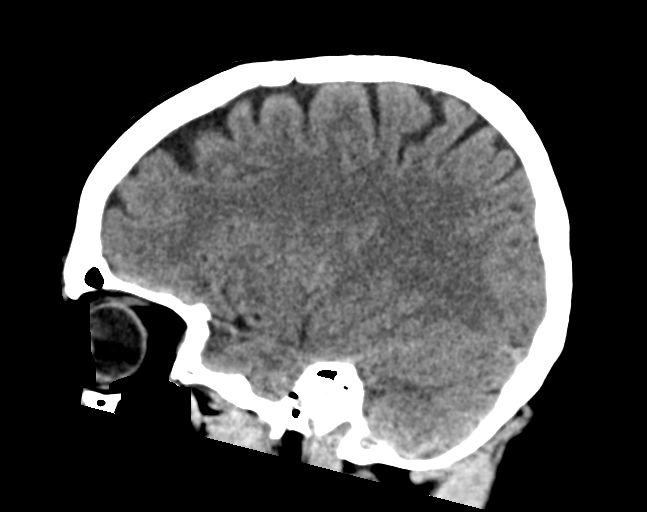

[16 of 47 positions shown; findings below may reference images not displayed]

FINDINGS: CT HEAD FINDINGS

Brain: No evidence of acute infarction, hemorrhage, hydrocephalus,
extra-axial collection or mass lesion/mass effect.

Vascular: No hyperdense vessel or unexpected calcification.

Skull: Normal. Negative for fracture or focal lesion.

Sinuses/Orbits: No acute finding.

Other: Mild left supraorbital soft tissue swelling. No scalp
hematoma.

CT CERVICAL SPINE FINDINGS

Alignment: Facet joints are aligned without dislocation or traumatic
listhesis. Dens and lateral masses are aligned. Straightening of the
cervical lordosis.

Skull base and vertebrae: No acute fracture. No primary bone lesion
or focal pathologic process.

Soft tissues and spinal canal: No prevertebral fluid or swelling. No
visible canal hematoma.

Disc levels: Preserved cervical disc heights. Minimal facet
arthropathy.

Upper chest: Included lung apices are clear.

Other: None.
IMPRESSION: 1. No acute intracranial findings.
2. No acute fracture or traumatic listhesis of the cervical spine.
3. Mild left supraorbital soft tissue swelling.

## 2022-04-18 ENCOUNTER — Other Ambulatory Visit: Payer: Self-pay

## 2022-04-19 ENCOUNTER — Other Ambulatory Visit: Payer: Self-pay

## 2022-04-19 MED ORDER — ARIPIPRAZOLE 2 MG PO TABS
2.0000 mg | ORAL_TABLET | Freq: Every day | ORAL | 0 refills | Status: AC
  Filled 2022-04-19: qty 30, 30d supply, fill #0

## 2022-04-19 MED ORDER — DESVENLAFAXINE SUCCINATE ER 25 MG PO TB24
25.0000 mg | ORAL_TABLET | Freq: Every morning | ORAL | 0 refills | Status: AC
  Filled 2022-04-19: qty 30, 30d supply, fill #0

## 2022-09-02 DEATH — deceased
